# Patient Record
Sex: Female | Born: 1965 | Race: Black or African American | Hispanic: No | Marital: Married | Smoking: Former smoker
Health system: Southern US, Community
[De-identification: ages and names within clinical notes are randomized; demographics above are authoritative.]

## PROBLEM LIST (undated history)

## (undated) DIAGNOSIS — F32A Depression, unspecified: Secondary | ICD-10-CM

## (undated) DIAGNOSIS — M545 Low back pain, unspecified: Secondary | ICD-10-CM

## (undated) DIAGNOSIS — F329 Major depressive disorder, single episode, unspecified: Secondary | ICD-10-CM

## (undated) DIAGNOSIS — E119 Type 2 diabetes mellitus without complications: Secondary | ICD-10-CM

## (undated) DIAGNOSIS — R202 Paresthesia of skin: Secondary | ICD-10-CM

## (undated) DIAGNOSIS — R200 Anesthesia of skin: Secondary | ICD-10-CM

## (undated) HISTORY — DX: Paresthesia of skin: R20.2

## (undated) HISTORY — DX: Depression, unspecified: F32.A

## (undated) HISTORY — DX: Major depressive disorder, single episode, unspecified: F32.9

## (undated) HISTORY — DX: Paresthesia of skin: R20.0

## (undated) HISTORY — DX: Type 2 diabetes mellitus without complications: E11.9

## (undated) HISTORY — PX: UPPER GASTROINTESTINAL ENDOSCOPY: SHX188

## (undated) HISTORY — PX: LAPAROSCOPIC ABDOMINAL EXPLORATION: SHX6249

## (undated) HISTORY — DX: Low back pain, unspecified: M54.50

## (undated) HISTORY — DX: Low back pain: M54.5

---

## 2002-10-04 HISTORY — PX: CRYOABLATION: SHX1415

## 2003-10-05 HISTORY — PX: ABDOMINAL HYSTERECTOMY: SHX81

## 2011-11-09 ENCOUNTER — Encounter (HOSPITAL_COMMUNITY): Payer: Self-pay

## 2011-11-09 ENCOUNTER — Emergency Department (HOSPITAL_COMMUNITY)
Admission: EM | Admit: 2011-11-09 | Discharge: 2011-11-09 | Disposition: A | Discharge status: Home / Self Care | Payer: Self-pay | Attending: Emergency Medicine | Admitting: Emergency Medicine

## 2011-11-09 DIAGNOSIS — X58XXXA Exposure to other specified factors, initial encounter: Secondary | ICD-10-CM | POA: Insufficient documentation

## 2011-11-09 DIAGNOSIS — R51 Headache: Secondary | ICD-10-CM | POA: Insufficient documentation

## 2011-11-09 DIAGNOSIS — K089 Disorder of teeth and supporting structures, unspecified: Secondary | ICD-10-CM | POA: Insufficient documentation

## 2011-11-09 DIAGNOSIS — K029 Dental caries, unspecified: Secondary | ICD-10-CM | POA: Insufficient documentation

## 2011-11-09 DIAGNOSIS — S025XXA Fracture of tooth (traumatic), initial encounter for closed fracture: Secondary | ICD-10-CM | POA: Insufficient documentation

## 2011-11-09 DIAGNOSIS — K0889 Other specified disorders of teeth and supporting structures: Secondary | ICD-10-CM

## 2011-11-09 MED ORDER — OXYCODONE-ACETAMINOPHEN 5-325 MG PO TABS: 2.0000 | ORAL_TABLET | Freq: Four times a day (QID) | ORAL | Status: AC | PRN

## 2011-11-09 MED ORDER — PENICILLIN V POTASSIUM 500 MG PO TABS: 1000.0000 mg | ORAL_TABLET | Freq: Two times a day (BID) | ORAL | Status: AC

## 2011-11-09 NOTE — ED Notes (Signed)
Lt. Top tooth cracked a few days ago and pt. Is having severe pain, and swelling

## 2011-11-09 NOTE — ED Provider Notes (Signed)
History     CSN: [id number]  Arrival date & time 11/09/11  1717   First MD Initiated Contact with Patient 11/09/11 2018      Chief Complaint  Patient presents with  . Dental Pain    (Consider location/radiation/quality/duration/timing/severity/associated sxs/prior treatment) HPI This 46 year old female has a history of multiple dental extractions from decay and just moved in [place] area a couple weeks ago from [place]. She states one of her teeth with decay fractured 2 days ago and she has pain in that tooth since that time and the left molar region of tooth #14. She is no facial swelling but does have facial pain localized without radiation to the dental area she has no difficulty swallowing no stridor no drooling no neck pain or swelling no fevers no pus drainage from her mouth no chest pain or shortness of breath or other concerns. Her pain is sharp localized and severe without radiation or associated symptoms and no prior treatment. History reviewed. No pertinent past medical history.  Past Surgical History  Procedure Date  . Abdominal hysterectomy     No family history on file.  History  Substance Use Topics  . Smoking status: Former Games developer  . Smokeless tobacco: Not on file  . Alcohol Use: No    OB History    Grav Para Term Preterm Abortions TAB SAB Ect Mult Living                  Review of Systems  Constitutional: Negative for fever.       10 Systems reviewed and are negative for acute change except as noted in the HPI.  HENT: Positive for dental problem. Negative for congestion.   Eyes: Negative for discharge and redness.  Respiratory: Negative for cough and shortness of breath.   Cardiovascular: Negative for chest pain.  Gastrointestinal: Negative for vomiting and abdominal pain.  Musculoskeletal: Negative for back pain.  Skin: Negative for rash.  Neurological: Negative for syncope, numbness and headaches.  Psychiatric/Behavioral:       No behavior  change.    Allergies  Review of patient's allergies indicates no known allergies.  Home Medications   Current Outpatient Rx  Name Route Sig Dispense Refill  . NAPROXEN SODIUM 220 MG PO TABS Oral Take 220 mg by mouth 2 (two) times daily as needed. For pain    . OXYCODONE-ACETAMINOPHEN 5-325 MG PO TABS Oral Take 2 tablets by mouth every 6 (six) hours as needed for pain. 20 tablet 0  . PENICILLIN V POTASSIUM 500 MG PO TABS Oral Take 2 tablets (1,000 mg total) by mouth 2 (two) times daily. X 7 days 28 tablet 0    BP 117/75  Pulse 91  Temp(Src) 98.4 F (36.9 C) (Oral)  Resp 12  Ht 5\' 4"  (1.626 m)  Wt 160 lb (72.576 kg)  BMI 27.46 kg/m2  SpO2 97%  Physical Exam  Nursing note and vitals reviewed. Constitutional:       Awake, alert, nontoxic appearance.  HENT:  Head: Atraumatic.  Mouth/Throat: Oropharynx is clear and moist.       Multiple prior dental extractions in her teeth are nontender except for tooth #14 which is decay present with slight subluxation and fracture present at the base from decay with localized gingival tenderness without fluctuance or purulent drainage and without airway compromise  Eyes: Pupils are equal, round, and reactive to light. Right eye exhibits no discharge. Left eye exhibits no discharge.  Neck: Neck supple.  Cardiovascular: Normal  rate and regular rhythm.   No murmur heard. Pulmonary/Chest: Effort normal and breath sounds normal. No respiratory distress. She has no wheezes. She has no rales. She exhibits no tenderness.  Abdominal: Soft. There is no tenderness. There is no rebound.  Musculoskeletal: She exhibits no tenderness.       Baseline ROM, no obvious new focal weakness.  Lymphadenopathy:    She has no cervical adenopathy.  Neurological:       Mental status and motor strength appears baseline for patient and situation.  Skin: No rash noted.  Psychiatric: She has a normal mood and affect.    ED Course  Procedures (including critical care  time)  Labs Reviewed - No data to display No results found.   1. Pain, dental   2. Tooth fracture   3. Dental cavity       MDM          Hurman Horn, MD 11/10/11 2120

## 2011-11-09 NOTE — ED Notes (Signed)
PT given ice pack 

## 2011-11-09 NOTE — ED Notes (Signed)
Pt ambualted with a steady gait; VSS; A&Ox3; no signs of distress; pt has no questions at this time; respirations even and unlabored; skin warm and dry.

## 2012-06-26 ENCOUNTER — Ambulatory Visit (INDEPENDENT_AMBULATORY_CARE_PROVIDER_SITE_OTHER): Payer: BC Managed Care – PPO | Admitting: Internal Medicine

## 2012-06-26 VITALS — BP 114/62 | HR 95 | Temp 98.3°F | Resp 16 | Ht 64.0 in | Wt 160.0 lb

## 2012-06-26 DIAGNOSIS — F5105 Insomnia due to other mental disorder: Secondary | ICD-10-CM

## 2012-06-26 DIAGNOSIS — F411 Generalized anxiety disorder: Secondary | ICD-10-CM

## 2012-06-26 DIAGNOSIS — F419 Anxiety disorder, unspecified: Secondary | ICD-10-CM

## 2012-06-26 DIAGNOSIS — F329 Major depressive disorder, single episode, unspecified: Secondary | ICD-10-CM

## 2012-06-26 DIAGNOSIS — F341 Dysthymic disorder: Secondary | ICD-10-CM

## 2012-06-26 DIAGNOSIS — F430 Acute stress reaction: Secondary | ICD-10-CM

## 2012-06-26 DIAGNOSIS — G4700 Insomnia, unspecified: Secondary | ICD-10-CM

## 2012-06-26 MED ORDER — CITALOPRAM HYDROBROMIDE 20 MG PO TABS: 20.0000 mg | ORAL_TABLET | Freq: Every day | ORAL | Status: DC

## 2012-06-26 MED ORDER — CLONAZEPAM 0.5 MG PO TABS: 0.5000 mg | ORAL_TABLET | Freq: Three times a day (TID) | ORAL | Status: DC | PRN

## 2012-06-26 NOTE — Progress Notes (Signed)
  Subjective:    Patient ID: Kelly Simon, female    DOB: [birth year], 46 y.o.   MRN: [id number]  HPI Referred to [place] by psychologist Tom Hedding.  89yo AA F pt's mom has a reoccurance of ovarian ca (first dx 35 years ago) and she is experiencing stress related to balancing caring for her mom in [place] and working in [place].  Her dad is the main caregiver to her mother, but he has reached a point where he is needing more help.  The grief she experiences has been preventing her from sleeping and making her less interested in participating in her normal activities.  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  She complains of feeling depressed.  She denies feelings of anxiety, but does complain of feeling overwhelmed.  She has one month FMLA from Dr Ellery Plunk before returning to work. Has sxt anhedonism,pervasive anxiety, withdrawal from activities,insomnia,Decreased productivity, decreased attention, crying spells, reduced motivation and very low energy. There is no past history of depression or anxiety or need for treatment.   PMHX:  DMII - controlled with diet and exercise (recent weight loss in past 2 years)160lbs  Meds: Not on any medications currently.  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 clonazePAM (KLONOPIN) 0.5 MG tablet    Sig: Take 1 tablet (0.5 mg total) by mouth 3 (three) times daily as needed for anxiety.    Dispense:  90 tablet    Refill:  0  .  citalopram (CELEXA) 20 MG tablet    Sig: Take 1 tablet (20 mg total) by mouth daily.    Dispense:  30 tablet    Refill:  3   Continue counseling with Dr. Ellery Plunk Celexa for depressive symptoms Counseled pt. regarding options to help her address her mothers health care needs  F/U 2 weeks RTC as needed

## 2012-06-28 NOTE — Progress Notes (Signed)
appt made with Dr. Merla Riches for 07/12/12

## 2012-07-09 ENCOUNTER — Encounter: Payer: Self-pay | Admitting: Internal Medicine

## 2012-07-12 ENCOUNTER — Encounter: Payer: Self-pay | Admitting: Internal Medicine

## 2012-07-12 ENCOUNTER — Ambulatory Visit (INDEPENDENT_AMBULATORY_CARE_PROVIDER_SITE_OTHER): Payer: BC Managed Care – PPO | Admitting: Internal Medicine

## 2012-07-12 VITALS — BP 116/82 | HR 76 | Temp 98.8°F | Resp 16 | Ht 63.0 in | Wt 156.0 lb

## 2012-07-12 DIAGNOSIS — Z23 Encounter for immunization: Secondary | ICD-10-CM

## 2012-07-12 DIAGNOSIS — F418 Other specified anxiety disorders: Secondary | ICD-10-CM | POA: Insufficient documentation

## 2012-07-12 DIAGNOSIS — R002 Palpitations: Secondary | ICD-10-CM

## 2012-07-12 DIAGNOSIS — IMO0001 Reserved for inherently not codable concepts without codable children: Secondary | ICD-10-CM

## 2012-07-12 DIAGNOSIS — E119 Type 2 diabetes mellitus without complications: Secondary | ICD-10-CM

## 2012-07-12 DIAGNOSIS — F341 Dysthymic disorder: Secondary | ICD-10-CM

## 2012-07-12 LAB — COMPREHENSIVE METABOLIC PANEL
Albumin: 4.3 g/dL (ref 3.5–5.2)
Alkaline Phosphatase: 89 U/L (ref 39–117)
BUN: 8 mg/dL (ref 6–23)
Glucose, Bld: 334 mg/dL — ABNORMAL HIGH (ref 70–99)
Potassium: 4.3 mEq/L (ref 3.5–5.3)

## 2012-07-12 LAB — CBC WITH DIFFERENTIAL/PLATELET
Basophils Relative: 1 % (ref 0–1)
Eosinophils Absolute: 0.1 10*3/uL (ref 0.0–0.7)
HCT: 44.7 % (ref 36.0–46.0)
Hemoglobin: 15.1 g/dL — ABNORMAL HIGH (ref 12.0–15.0)
MCH: 28.2 pg (ref 26.0–34.0)
MCHC: 33.8 g/dL (ref 30.0–36.0)
MCV: 83.4 fL (ref 78.0–100.0)
Monocytes Absolute: 0.4 10*3/uL (ref 0.1–1.0)
Monocytes Relative: 6 % (ref 3–12)

## 2012-07-12 LAB — LIPID PANEL
Cholesterol: 177 mg/dL (ref 0–200)
Triglycerides: 155 mg/dL — ABNORMAL HIGH (ref <150)
VLDL: 31 mg/dL (ref 0–40)

## 2012-07-12 MED ORDER — CLONAZEPAM 0.5 MG PO TABS: 0.5000 mg | ORAL_TABLET | Freq: Three times a day (TID) | ORAL | Status: DC | PRN

## 2012-07-12 MED ORDER — METFORMIN HCL 1000 MG PO TABS: 1000.0000 mg | ORAL_TABLET | Freq: Two times a day (BID) | ORAL | Status: DC

## 2012-07-12 NOTE — Progress Notes (Signed)
  Subjective:    Patient ID: Kelly Simon, female    DOB: Feb 08, 1966, 46 y.o.   MRN: [id number]  [facility] had some response to medication and continued therapy although her dose of Klonopin makes her sleepy so he she is reduced at a half. No side effects with Celexa. She still has occas insomnia. Her mother has improved slightly with chemotherapy now that she is over the side effects.Wakes and doesn't want to get out of bed afraid of what will happen next.  Also has noticed a few pounds of weight loss but denies polyuria or polydipsia. Diabetes was diagnosed at age 89 she was started immediately on Lantus with a daytime sliding scale of NovoLog. She discontinued this in 2011 when she was stable and after having lost from 280 to 156 pounds by virtue of a good diet/Exercise plan. She is Not followed for any care As she has recently moved here. She has noticed some palpitations with her recent weight loss.   Review of Systems Mild fatigue/no chills or night sweats Denies vision changes/no recent ophthalmology exam No shortness of breath or dyspnea on exertion No chest pain/no edema No genitourinary or gastrointestinal complaints No paresthesias or gait problems/no history of neuropathy    Objective:   Physical Exam Weight 156 Blood pressure 116/82  Skin clear HEENT clear No thyromegaly or lymphadenopathy/peers a questionable nodule on the right side and this will be followed up at her next exam Chest clear to auscultation Heart regular without murmur Extremities with no sensory or vascular loss and no edema       Results for orders placed in visit on 07/12/12  POCT GLYCOSYLATED HEMOGLOBIN (HGB A1C)      Component Value Range   Hemoglobin A1C =>14.0      Assessment & Plan:  Problem #1 diabetes mellitus with loss of control She will start on metformin 1 g twice a day Other screening labs will be performed  Problem #2 depression with anxiety-Only mild improvement Continue same  plan/therapy  Problem #3 palpitations Include TSH Recheck thyroid exam at followup  Meds ordered this encounter  Medications  . metFORMIN (GLUCOPHAGE) 1000 MG tablet    Sig: Take 1 tablet (1,000 mg total) by mouth 2 (two) times daily with a meal.    Dispense:  180 tablet    Refill:  3  . clonazePAM (KLONOPIN) 0.5 MG tablet    Sig: Take 1 tablet (0.5 mg total) by mouth 3 (three) times daily as needed for anxiety.    Dispense:  90 tablet    Refill:  2  No home monitoring for now/ recheck 2 weeks She continues out of work on [employer] from her psychologist and I concur with this decision

## 2012-07-17 ENCOUNTER — Encounter: Payer: Self-pay | Admitting: Internal Medicine

## 2012-07-26 ENCOUNTER — Encounter: Payer: Self-pay | Admitting: Internal Medicine

## 2012-07-26 ENCOUNTER — Ambulatory Visit (INDEPENDENT_AMBULATORY_CARE_PROVIDER_SITE_OTHER): Payer: BC Managed Care – PPO | Admitting: Internal Medicine

## 2012-07-26 VITALS — BP 110/72 | HR 96 | Temp 98.3°F | Resp 16 | Ht 63.0 in | Wt 150.8 lb

## 2012-07-26 DIAGNOSIS — IMO0001 Reserved for inherently not codable concepts without codable children: Secondary | ICD-10-CM

## 2012-07-26 MED ORDER — GLIPIZIDE 10 MG PO TABS: 10.0000 mg | ORAL_TABLET | Freq: Two times a day (BID) | ORAL | Status: DC

## 2012-07-26 NOTE — Progress Notes (Signed)
  Subjective:    Patient ID: Kelly Simon, female    DOB: Jun 02, 1966, 46 y.o.   MRN: [id number]  46 yo F presents for routine health care especially f/u on DM.  HPI At her last visit we were surprised her DM was more out of control than expected.  THe pt c/o stomach problems 1-2 hrs after taking the metformin. She discontinued the medication She would like to avoid resuming insulin which was started at the original diagnosis without trying oral agents The pt c/o ingrown toenails.  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  At this time she is pain free.  However the big toe on her R foot sometimes pinches her, when this happens next we will refer her to local podiatrists.    She is now taking  Only 1/2 of her clonezepam pill tid, she is less drowsy and this seems to be working for her.    Review of Systems No weight loss No fevers or night sweats No chest pain or palpitations No edema GI/GU negative    Objective:   Physical Exam Filed Vitals:   07/26/12 1124  BP: 110/72  Pulse: 96  Temp: 98.3 F (36.8 C)  Resp: 16   Heart regular Lungs clear       Assessment & Plan:  Problem #1 diabetes mellitus uncontrolled Meds ordered this encounter  Medications  . glipiZIDE (GLUCOTROL) 10 MG tablet    Sig: Take 1 tablet (10 mg total) by mouth 2 (two) times daily before a meal.    Dispense:  60 tablet    Refill:  3  Recheck in 3 weeks

## 2012-08-12 ENCOUNTER — Other Ambulatory Visit: Payer: Self-pay | Admitting: Internal Medicine

## 2012-08-16 ENCOUNTER — Ambulatory Visit (INDEPENDENT_AMBULATORY_CARE_PROVIDER_SITE_OTHER): Payer: BC Managed Care – PPO | Admitting: Internal Medicine

## 2012-08-16 ENCOUNTER — Encounter: Payer: Self-pay | Admitting: Internal Medicine

## 2012-08-16 VITALS — BP 120/88 | HR 81 | Temp 98.8°F | Resp 16 | Ht 63.0 in | Wt 167.4 lb

## 2012-08-16 DIAGNOSIS — E119 Type 2 diabetes mellitus without complications: Secondary | ICD-10-CM

## 2012-08-16 DIAGNOSIS — F418 Other specified anxiety disorders: Secondary | ICD-10-CM

## 2012-08-16 DIAGNOSIS — Z23 Encounter for immunization: Secondary | ICD-10-CM

## 2012-08-16 DIAGNOSIS — E669 Obesity, unspecified: Secondary | ICD-10-CM | POA: Insufficient documentation

## 2012-08-16 DIAGNOSIS — Z6829 Body mass index (BMI) 29.0-29.9, adult: Secondary | ICD-10-CM

## 2012-08-16 MED ORDER — SITAGLIPTIN PHOSPHATE 100 MG PO TABS: 100.0000 mg | ORAL_TABLET | Freq: Every day | ORAL | Status: DC

## 2012-08-16 NOTE — Progress Notes (Signed)
  Subjective:    Patient ID: Kelly Simon, female    DOB: [birth year], 46 y.o.   MRN: [id number]  HPIf/u DM-uncontrolled Hemoglobin A1c over 14 one month ago Started metformin and developed GI intolerance rapidly so discontinued Started Glucotrol 10 mg XL 2 weeks ag0-actually 3 weeks ago and blood sugars have dropped from 300-180 early morning fasting No side effects   In the interim has been started on Abilify by new psychiatrist Headen-Continues therapy with psychologyHEDDING///No change in Celexa or Klonopin started by [place]   Review of Systems     Objective:   Physical Exam No changes from last visit       Assessment & Plan:  Diabetes uncontrolled BMI 29  Continued Glucotrol 10 XL Head Januvia 100 daily Plan on repeat hemoglobin A1c in 2 months Continue daily blood sugars and followup sooner if loses control Remember she is trying to avoid injection therapy Refill medicines when necessary in the meantime Discussed the role of whole fresh foods with calorie control and weight loss approaching ideal body weight of less than 130 pounds

## 2012-10-12 ENCOUNTER — Ambulatory Visit (INDEPENDENT_AMBULATORY_CARE_PROVIDER_SITE_OTHER): Payer: BC Managed Care – PPO | Admitting: Physician Assistant

## 2012-10-12 ENCOUNTER — Telehealth: Payer: Self-pay

## 2012-10-12 VITALS — BP 117/73 | HR 95 | Temp 98.0°F | Resp 16 | Ht 64.0 in | Wt 160.0 lb

## 2012-10-12 DIAGNOSIS — E119 Type 2 diabetes mellitus without complications: Secondary | ICD-10-CM

## 2012-10-12 DIAGNOSIS — E162 Hypoglycemia, unspecified: Secondary | ICD-10-CM

## 2012-10-12 DIAGNOSIS — R8271 Bacteriuria: Secondary | ICD-10-CM

## 2012-10-12 DIAGNOSIS — R82998 Other abnormal findings in urine: Secondary | ICD-10-CM

## 2012-10-12 LAB — POCT URINALYSIS DIPSTICK
Protein, UA: NEGATIVE
Urobilinogen, UA: 0.2
pH, UA: 5.5

## 2012-10-12 LAB — POCT CBC
Granulocyte percent: 42.2 %G (ref 37–80)
Lymph, poc: 3.7 — AB (ref 0.6–3.4)
MCH, POC: 27 pg (ref 27–31.2)
MCHC: 31 g/dL — AB (ref 31.8–35.4)
MCV: 87.3 fL (ref 80–97)
MID (cbc): 0.5 (ref 0–0.9)
POC LYMPH PERCENT: 51.4 %L — AB (ref 10–50)
Platelet Count, POC: 310 10*3/uL (ref 142–424)
RDW, POC: 14.9 %
WBC: 7.2 10*3/uL (ref 4.6–10.2)

## 2012-10-12 LAB — POCT GLYCOSYLATED HEMOGLOBIN (HGB A1C): Hemoglobin A1C: 7.6

## 2012-10-12 LAB — POCT UA - MICROSCOPIC ONLY
Casts, Ur, LPF, POC: NEGATIVE
Crystals, Ur, HPF, POC: NEGATIVE
Yeast, UA: NEGATIVE

## 2012-10-12 NOTE — Patient Instructions (Signed)
STOP the glipizide. Eat small frequent meals and check your blood sugar at least twice daily: once when you are fasting and once 2-3 hours after the largest meal of the day. Please check at other times during the day if you feel like it might be low.

## 2012-10-12 NOTE — Progress Notes (Signed)
Subjective:    Patient ID: Kelly Simon, female    DOB: Oct 24, 1965, 47 y.o.   MRN: [id number]  HPI This 47 y.o. female presents for evaluation of hypoglycemia.  She called earlier today asking what she should do with a blood sugar of 40.  She was advised to come in to be seen.  She arrived and was triaged and seen my me urgently.  She was given apple juice and peanut butter crackers while I took the history. She feels as though she may pass out, and feels dizzy.  No HA, SOB, CP, vision changes.  No nausea, vomiting, stool changes.  Some muscle cramping, which she recalls having had before with low blood sugar.  Glucose typically 80's-120's.  Today her highest reading was 11:50 am, 69.  Progressively lower during the day, despite normal eating.  She notes that her DM was long controlled without medications after she lost 139 pounds.  Then she developed depression and started treatment, which then resulted in loss of glucose control.  The last medication change was 09/19/2012 when the Abilify was increased from 2 mg to 5 mg.   Past Medical History  Diagnosis Date  . Diabetes mellitus without complication   . Depression     Past Surgical History  Procedure Date  . Abdominal hysterectomy     Prior to Admission medications   Medication Sig Start Date End Date Taking? Authorizing Provider  ARIPiprazole (ABILIFY) 5 MG tablet Take 5 mg by mouth daily.   Yes Historical Provider, MD  citalopram (CELEXA) 20 MG tablet Take 1 tablet (20 mg total) by mouth daily. 06/26/12  Yes Tonye Pearson, MD  clonazePAM (KLONOPIN) 0.5 MG tablet Take 1 tablet (0.5 mg total) by mouth 3 (three) times daily as needed for anxiety. 07/12/12  Yes Tonye Pearson, MD  glipiZIDE (GLUCOTROL) 10 MG tablet Take 1 tablet (10 mg total) by mouth 2 (two) times daily before a meal. 07/26/12  Yes Tonye Pearson, MD  sitaGLIPtin (JANUVIA) 100 MG tablet Take 1 tablet (100 mg total) by mouth daily. 08/16/12  Yes Tonye Pearson, MD    Allergies  Allergen Reactions  . Adhesive (Tape) Rash    History   Social History  . Marital Status: Single    Spouse Name: [name]    Number of Children: 3  . Years of Education: 12+   Occupational History  . CUSTOMER SERVICE    Social History Main Topics  . Smoking status: Former Smoker -- 26 years    Types: Cigarettes    Quit date: 04/03/2008  . Smokeless tobacco: Never Used  . Alcohol Use: No  . Drug Use: No  . Sexually Active: Yes -- Female partner(s)    Birth Control/ Protection: Surgical   Other Topics Concern  . Not on file   Social History Narrative   Engaged to be married.  Lives with him currently.  Two sons are grown and live independently.  The youngest is 28 and lives with her.    Family History  Problem Relation Age of Onset  . Cancer Mother     Cervical s/p hysterectomy; mets to the abdomen  . Hypertension Mother     Review of Systems As above.    Objective:   Physical Exam Blood pressure 117/73, pulse 95, temperature 98 F (36.7 C), temperature source Oral, resp. rate 16, height 5\' 4"  (1.626 m), weight 160 lb (72.576 kg), SpO2 97.00%. Body mass index is 27.46 kg/(m^2). Well-developed,  well nourished BF who is awake, alert and oriented, in NAD. HEENT: [place]/AT, PERRL, EOMI.  Sclera and conjunctiva are clear.   Neck: supple, non-tender, no lymphadenopathy, thyromegaly. Heart: RRR, no murmur Lungs: normal effort, CTA Extremities: no cyanosis, clubbing or edema. Skin: warm and dry without rash. Psychologic: good mood and appropriate affect, normal speech and behavior.  Glucose rose to 68 (on her meter) after a juice box and packet of crackers. She reports feeling better.  Results for orders placed in visit on 10/12/12  GLUCOSE, POCT (MANUAL RESULT ENTRY)      Component Value Range   POC Glucose 65 (*) 70 - 99 mg/dl  POCT GLYCOSYLATED HEMOGLOBIN (HGB A1C)      Component Value Range   Hemoglobin A1C 7.6    POCT CBC       Component Value Range   WBC 7.2  4.6 - 10.2 K/uL   Lymph, poc 3.7 (*) 0.6 - 3.4   POC LYMPH PERCENT 51.4 (*) 10 - 50 %L   MID (cbc) 0.5  0 - 0.9   POC MID % 6.4  0 - 12 %M   POC Granulocyte 3.0  2 - 6.9   Granulocyte percent 42.2  37 - 80 %G   RBC 4.33  4.04 - 5.48 M/uL   Hemoglobin 11.7 (*) 12.2 - 16.2 g/dL   HCT, POC 78.2  95.6 - 47.9 %   MCV 87.3  80 - 97 fL   MCH, POC 27.0  27 - 31.2 pg   MCHC 31.0 (*) 31.8 - 35.4 g/dL   RDW, POC 21.3     Platelet Count, POC 310  142 - 424 K/uL   MPV 8.2  0 - 99.8 fL  POCT UA - MICROSCOPIC ONLY      Component Value Range   WBC, Ur, HPF, POC 2-4     RBC, urine, microscopic 0-2     Bacteria, U Microscopic 3+     Mucus, UA neg     Epithelial cells, urine per micros 2-3     Crystals, Ur, HPF, POC neg     Casts, Ur, LPF, POC neg     Yeast, UA neg    POCT URINALYSIS DIPSTICK      Component Value Range   Color, UA yellow     Clarity, UA cloudy     Glucose, UA neg     Bilirubin, UA neg     Ketones, UA neg     Spec Grav, UA >=1.030     Blood, UA neg     pH, UA 5.5     Protein, UA neg     Urobilinogen, UA 0.2     Nitrite, UA neg     Leukocytes, UA Trace     Glucose 68 before she left (on her meter)    Assessment & Plan:   1. Hypoglycemia  POCT glucose (manual entry), POCT CBC, POCT UA - Microscopic Only, POCT urinalysis dipstick  2. DM (diabetes mellitus)  POCT glycosylated hemoglobin (Hb A1C)  3. Bacteria in urine  Urine culture   HOLD GLIPIZIDE.  She is feeling better.  She is instructed to eat a full meal, with more carbs than usual, upon going home.  She needs to carefully monitor her glucose this evening and over night.  If it drops below 40, she will call 911.  She has an OV with Dr. Merla Riches 10/18/2012, sooner if needed.

## 2012-10-12 NOTE — Telephone Encounter (Signed)
Yes patient needs to be seen today.  We are open until 8:30pm.  We will likely need to adjust medications.

## 2012-10-12 NOTE — Telephone Encounter (Signed)
Should I tell pt to RTC for evaluation and possible change of her meds?

## 2012-10-12 NOTE — Telephone Encounter (Signed)
Called pt, LMOM to RTC and call back.

## 2012-10-12 NOTE — Telephone Encounter (Signed)
PT STATES SHE IS DIABETIC AND IS ON MEDICINE BUT HER SUGAR STILL HAVE BEEN IN THE 40'S OR JUST A LITTLE HIGHER DIDN'T KNOW IF HER MEDS NEED CHANGING PLEASE CALL [phone]

## 2012-10-13 NOTE — Telephone Encounter (Signed)
Pt came in to be seen on 10/12/12 after getting our message.

## 2012-10-17 ENCOUNTER — Other Ambulatory Visit: Payer: Self-pay | Admitting: *Deleted

## 2012-10-17 LAB — URINE CULTURE

## 2012-10-17 MED ORDER — CEPHALEXIN 500 MG PO CAPS: 500.0000 mg | ORAL_CAPSULE | Freq: Four times a day (QID) | ORAL | Status: DC

## 2012-10-18 ENCOUNTER — Ambulatory Visit (INDEPENDENT_AMBULATORY_CARE_PROVIDER_SITE_OTHER): Payer: BC Managed Care – PPO | Admitting: Internal Medicine

## 2012-10-18 VITALS — BP 103/65 | HR 84 | Temp 98.1°F | Resp 16 | Ht 63.0 in | Wt 155.0 lb

## 2012-10-18 DIAGNOSIS — Z1231 Encounter for screening mammogram for malignant neoplasm of breast: Secondary | ICD-10-CM

## 2012-10-18 DIAGNOSIS — E119 Type 2 diabetes mellitus without complications: Secondary | ICD-10-CM

## 2012-10-18 NOTE — Addendum Note (Signed)
Addended by: Donna Christen L on: 10/18/2012 11:38 AM   Modules accepted: Orders

## 2012-10-18 NOTE — Progress Notes (Signed)
  Subjective:    Patient ID: Kelly Simon, female    DOB: 12-15-1965, 47 y.o.   MRN: [id number]  HPIsee 1/10 hypogly Off glip No hypo since Am sugars 70-90 on Januvia 100 Weight down 12lbs since 11/13 OV via increased exercise Mental status more stable    Review of Systems     Objective:   Physical Exam BP 103/65  Pulse 84  Temp 98.1 F (36.7 C)  Resp 16  Ht 5\' 3"  (1.6 m)  Wt 155 lb (70.308 kg)  BMI 27.46 kg/m2 orientedTTP Perrla CN2-12 intact Mood good Judgement sound       Assessment & Plan:  AODM-much better control  D/c Januvia and follow morning BS F/u 4/14 Call sooner if pattern>120in am

## 2013-01-17 ENCOUNTER — Ambulatory Visit (INDEPENDENT_AMBULATORY_CARE_PROVIDER_SITE_OTHER): Payer: BC Managed Care – PPO | Admitting: Internal Medicine

## 2013-01-17 VITALS — BP 116/80 | HR 70 | Temp 98.2°F | Resp 16 | Ht 63.5 in | Wt 170.0 lb

## 2013-01-17 DIAGNOSIS — E119 Type 2 diabetes mellitus without complications: Secondary | ICD-10-CM

## 2013-01-17 DIAGNOSIS — F341 Dysthymic disorder: Secondary | ICD-10-CM

## 2013-01-17 DIAGNOSIS — Z6829 Body mass index (BMI) 29.0-29.9, adult: Secondary | ICD-10-CM

## 2013-01-17 DIAGNOSIS — F418 Other specified anxiety disorders: Secondary | ICD-10-CM

## 2013-01-17 NOTE — Progress Notes (Signed)
  Subjective:    Patient ID: Kelly Simon, female    DOB: [birth year], 47 y.o.   MRN: [id number]  HPIsee 1/14-stopped Januvia Off glip due to hypogly 1/14 Home BS wnl!!!despite wt gain  Back to work [place]/commutes daily/no time for exercise or cooking own food 155 to 170 Review of Systems     Objective:   Physical Exam BP 116/80  Pulse 70  Temp(Src) 98.2 F (36.8 C)  Resp 16  Ht 5' 3.5" (1.613 m)  Wt 170 lb (77.111 kg)  BMI 29.64 kg/m2 Pupils equal round reactive to light and accommodation Heart regular No edema No sensory loss in feet   Results for orders placed in visit on 01/17/13  POCT GLYCOSYLATED HEMOGLOBIN (HGB A1C)      Result Value Range   Hemoglobin A1C 5.4         Assessment & Plan:  DM (diabetes mellitus) -this hemoglobin A1c level is without medication for 3 months and should indicate that she is no longer glucose intolerant/she may followup in 1 year Continue to check home blood sugars once a week  Depression with anxiety--continue with psychiatry  BMI 29.0-29.9,adult

## 2013-05-02 ENCOUNTER — Ambulatory Visit (INDEPENDENT_AMBULATORY_CARE_PROVIDER_SITE_OTHER): Payer: BC Managed Care – PPO | Admitting: Internal Medicine

## 2013-05-02 VITALS — BP 97/61 | HR 72 | Temp 98.2°F | Resp 16 | Ht 64.0 in | Wt 186.0 lb

## 2013-05-02 DIAGNOSIS — Z6829 Body mass index (BMI) 29.0-29.9, adult: Secondary | ICD-10-CM

## 2013-05-02 DIAGNOSIS — R635 Abnormal weight gain: Secondary | ICD-10-CM

## 2013-05-02 DIAGNOSIS — D649 Anemia, unspecified: Secondary | ICD-10-CM

## 2013-05-02 DIAGNOSIS — E119 Type 2 diabetes mellitus without complications: Secondary | ICD-10-CM

## 2013-05-02 NOTE — Progress Notes (Signed)
  Subjective:    Patient ID: Kelly Simon, female    DOB: June 27, 1966, 48 y.o.   MRN: [id number]  HPIgained 170-186 3 months Feels lightheaded, sl blurr vision(eye exam was OK but correction needed has increased-no DM chges) Tingling in fingers and toes off and on when working Lots of stress-Mom died after long battle with cerv Ca in May Eating more Walking less(was at 2 hr/day)  No new psych meds or dose chges   Review of Systems  Constitutional: Positive for activity change, appetite change and unexpected weight change. Negative for fatigue.  HENT: Negative for neck pain.   Eyes: Positive for visual disturbance. Negative for photophobia, pain and redness.  Respiratory: Negative for chest tightness, shortness of breath and wheezing.   Cardiovascular: Negative for chest pain, palpitations and leg swelling.  Endocrine: Negative for cold intolerance, heat intolerance and polyuria.  Neurological: Negative for headaches.       Objective:   Physical Exam  Constitutional: She is oriented to person, place, and time. She appears well-developed and well-nourished.  HENT:  Head: Normocephalic.  Right Ear: External ear normal.  Left Ear: External ear normal.  Nose: Nose normal.  Mouth/Throat: Oropharynx is clear and moist.  Eyes: Conjunctivae and EOM are normal. Pupils are equal, round, and reactive to light.  Neck: Neck supple. No thyromegaly present.  Cardiovascular: Normal rate, regular rhythm, normal heart sounds and intact distal pulses.   No murmur heard. Pulmonary/Chest: Breath sounds normal. She has no wheezes.  Musculoskeletal: She exhibits no edema.  Neurological: She is alert and oriented to person, place, and time. She has normal reflexes. She displays normal reflexes. No cranial nerve deficit.  Psychiatric: She has a normal mood and affect. Her behavior is normal. Thought content normal.  BP 97/61  Pulse 72  Temp(Src) 98.2 F (36.8 C)  Resp 16  Ht 5\' 4"  (1.626 m)  Wt  186 lb (84.369 kg)  BMI 31.91 kg/m2   Results for orders placed in visit on 05/02/13  POCT GLYCOSYLATED HEMOGLOBIN (HGB A1C)      Result Value Range   Hemoglobin A1C 6.0            Assessment & Plan:  Diabetes -in remission///resume diet  Anemia-reck  BMI 29.0-29.9,adult incr to 31  Weight gain - Plan: TSH/resume exercise  Mood dis--no chg/psychiatry f/u  Paresthesias and lightheadedness--follow  F/u 6mos

## 2013-05-03 ENCOUNTER — Encounter: Payer: Self-pay | Admitting: Internal Medicine

## 2013-05-03 LAB — CBC
HCT: 38.7 % (ref 36.0–46.0)
Hemoglobin: 12.8 g/dL (ref 12.0–15.0)
MCHC: 33.1 g/dL (ref 30.0–36.0)
MCV: 82.3 fL (ref 78.0–100.0)
RDW: 15.5 % (ref 11.5–15.5)

## 2013-05-03 LAB — COMPREHENSIVE METABOLIC PANEL
AST: 14 U/L (ref 0–37)
Albumin: 4.3 g/dL (ref 3.5–5.2)
Alkaline Phosphatase: 85 U/L (ref 39–117)
BUN: 19 mg/dL (ref 6–23)
Calcium: 9.3 mg/dL (ref 8.4–10.5)
Chloride: 107 mEq/L (ref 96–112)
Creat: 0.82 mg/dL (ref 0.50–1.10)
Glucose, Bld: 76 mg/dL (ref 70–99)

## 2013-05-03 LAB — TSH: TSH: 1.174 u[IU]/mL (ref 0.350–4.500)

## 2013-11-15 ENCOUNTER — Ambulatory Visit (INDEPENDENT_AMBULATORY_CARE_PROVIDER_SITE_OTHER): Payer: BC Managed Care – PPO | Admitting: Family Medicine

## 2013-11-15 ENCOUNTER — Ambulatory Visit: Payer: BC Managed Care – PPO

## 2013-11-15 VITALS — BP 112/82 | HR 80 | Temp 98.0°F | Resp 16 | Ht 62.5 in | Wt 182.8 lb

## 2013-11-15 DIAGNOSIS — R05 Cough: Secondary | ICD-10-CM

## 2013-11-15 DIAGNOSIS — R059 Cough, unspecified: Secondary | ICD-10-CM

## 2013-11-15 DIAGNOSIS — E1165 Type 2 diabetes mellitus with hyperglycemia: Secondary | ICD-10-CM

## 2013-11-15 DIAGNOSIS — J9801 Acute bronchospasm: Secondary | ICD-10-CM

## 2013-11-15 DIAGNOSIS — IMO0001 Reserved for inherently not codable concepts without codable children: Secondary | ICD-10-CM

## 2013-11-15 DIAGNOSIS — N390 Urinary tract infection, site not specified: Secondary | ICD-10-CM

## 2013-11-15 DIAGNOSIS — B370 Candidal stomatitis: Secondary | ICD-10-CM

## 2013-11-15 DIAGNOSIS — J209 Acute bronchitis, unspecified: Secondary | ICD-10-CM

## 2013-11-15 LAB — POCT CBC
GRANULOCYTE PERCENT: 61.4 % (ref 37–80)
HCT, POC: 46.7 % (ref 37.7–47.9)
Hemoglobin: 14.6 g/dL (ref 12.2–16.2)
Lymph, poc: 2.8 (ref 0.6–3.4)
MCH, POC: 27.8 pg (ref 27–31.2)
MCHC: 31.3 g/dL — AB (ref 31.8–35.4)
MCV: 88.8 fL (ref 80–97)
MID (CBC): 0.5 (ref 0–0.9)
MPV: 9.3 fL (ref 0–99.8)
PLATELET COUNT, POC: 303 10*3/uL (ref 142–424)
POC GRANULOCYTE: 5.3 (ref 2–6.9)
POC LYMPH %: 32.6 % (ref 10–50)
POC MID %: 6 %M (ref 0–12)
RBC: 5.26 M/uL (ref 4.04–5.48)
RDW, POC: 14.1 %
WBC: 8.7 10*3/uL (ref 4.6–10.2)

## 2013-11-15 LAB — POCT INFLUENZA A/B
INFLUENZA A, POC: NEGATIVE
INFLUENZA B, POC: NEGATIVE

## 2013-11-15 LAB — COMPREHENSIVE METABOLIC PANEL
ALK PHOS: 125 U/L — AB (ref 39–117)
ALT: 11 U/L (ref 0–35)
AST: 11 U/L (ref 0–37)
Albumin: 4 g/dL (ref 3.5–5.2)
BILIRUBIN TOTAL: 0.5 mg/dL (ref 0.2–1.2)
BUN: 7 mg/dL (ref 6–23)
CO2: 24 mEq/L (ref 19–32)
Calcium: 9.5 mg/dL (ref 8.4–10.5)
Chloride: 103 mEq/L (ref 96–112)
Creat: 0.74 mg/dL (ref 0.50–1.10)
GLUCOSE: 361 mg/dL — AB (ref 70–99)
Potassium: 4.4 mEq/L (ref 3.5–5.3)
SODIUM: 141 meq/L (ref 135–145)
TOTAL PROTEIN: 6.8 g/dL (ref 6.0–8.3)

## 2013-11-15 LAB — POCT URINALYSIS DIPSTICK
Bilirubin, UA: NEGATIVE
Glucose, UA: 500
Ketones, UA: 8
LEUKOCYTES UA: NEGATIVE
Nitrite, UA: POSITIVE
PROTEIN UA: NEGATIVE
Spec Grav, UA: 1.015
UROBILINOGEN UA: 0.2
pH, UA: 5.5

## 2013-11-15 LAB — POCT UA - MICROSCOPIC ONLY
CASTS, UR, LPF, POC: NEGATIVE
CRYSTALS, UR, HPF, POC: NEGATIVE
Mucus, UA: NEGATIVE
YEAST UA: POSITIVE

## 2013-11-15 LAB — GLUCOSE, POCT (MANUAL RESULT ENTRY): POC Glucose: 414 mg/dl — AB (ref 70–99)

## 2013-11-15 LAB — POCT GLYCOSYLATED HEMOGLOBIN (HGB A1C)

## 2013-11-15 LAB — MICROALBUMIN, URINE: Microalb, Ur: 0.62 mg/dL (ref 0.00–1.89)

## 2013-11-15 MED ORDER — AZITHROMYCIN 250 MG PO TABS: ORAL_TABLET | ORAL | Status: DC

## 2013-11-15 MED ORDER — NYSTATIN 100000 UNIT/ML MT SUSP: 5.0000 mL | Freq: Four times a day (QID) | OROMUCOSAL | Status: DC

## 2013-11-15 MED ORDER — SITAGLIPTIN PHOSPHATE 50 MG PO TABS: 50.0000 mg | ORAL_TABLET | Freq: Every day | ORAL | Status: DC

## 2013-11-15 MED ORDER — FLUCONAZOLE 150 MG PO TABS: 150.0000 mg | ORAL_TABLET | Freq: Once | ORAL | Status: DC

## 2013-11-15 MED ORDER — ALBUTEROL SULFATE (2.5 MG/3ML) 0.083% IN NEBU
2.5000 mg | INHALATION_SOLUTION | Freq: Once | RESPIRATORY_TRACT | Status: AC
Start: 2013-11-15 — End: 2013-11-15
  Administered 2013-11-15: 2.5 mg via RESPIRATORY_TRACT

## 2013-11-15 MED ORDER — GLIPIZIDE 5 MG PO TABS: 5.0000 mg | ORAL_TABLET | Freq: Two times a day (BID) | ORAL | Status: DC

## 2013-11-15 NOTE — Progress Notes (Addendum)
Subjective:    Patient ID: Kelly Simon, female    DOB: May 06, 1966, 48 y.o.   MRN: [id number]  HPI This chart was scribed for Kelly Simon by Kelly Simon, Scribe. This patient was seen in room 3 and the patient's care was started at 11:32 AM.  HPI Comments: Kelly Simon is a 48 y.o. female who presents to the [facility] for a diabetes check.  Pt was last seen by Kelly Simon on 05/02/13 and her hemoglobin A1C was 6.0.  She is a type II diabetic, but hasn't had to take medication for it.  In the past 2 days, she reports her sugars have been in the 400's and they normally are in the 80s. She states she checks her sugars 3 times a day.  Pt feels very weak and complaining of her tongue being dry and cracking.  She states her tongue feels swollen.  Pt states she urinated 4 times during the night, which she usually doesn't have to wake up throughout the night.  Pt also complains of a developing cough.  Pt states her cough is productive of yellow mucous.  She reports she is becoming hoarse.  Pt states she has nausea and chills.  Pt denies fever, sweats, HA, otalgia, diarrhea, emesis, and dysuria.  She had a flu shot around October.    Pt states she had a hysterectomy in 2005 for adenomyosis.  Pt states her mother passed away at the age of 47 from cervical cancer.  Pt 's mother had HTN, but no diabetes.  Pt's father has prostate cancer.  She reports her siblings are healthy.  Pt denies smoking.    Past Surgical History  Procedure Laterality Date  . Abdominal hysterectomy      ADENOMYOSIS; ovaries resected.    Family History  Problem Relation Age of Onset  . Cancer Mother     Cervical s/p hysterectomy; mets to the abdomen  . Hypertension Mother   . Cancer Father     prostate cancer    History   Social History  . Marital Status: Single    Spouse Name: Kelly Simon    Number of Children: 3  . Years of Education: 12+   Occupational History  . CUSTOMER  SERVICE    Social History Main Topics  . Smoking status: Former Smoker -- 26 years    Types: Cigarettes    Quit date: 04/03/2008  . Smokeless tobacco: Never Used  . Alcohol Use: No  . Drug Use: No  . Sexual Activity:    Partners: Male    Birth Control/ Protection: Surgical   Other Topics Concern  . Not on file   Social History Narrative   Engaged to be married.  Lives with him currently.  Two sons are grown and live independently.  The youngest is 26 and lives with her.    Allergies  Allergen Reactions  . Metformin And Related     diarrhea  . Adhesive [Tape] Rash    Patient Active Problem List   Diagnosis Date Noted  . 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 Left-sided low back pain with left-sided sciatica 06/14/2014  . Type II or unspecified type diabetes mellitus without mention of complication, uncontrolled 04/12/2014  . Anemia 05/02/2013  . BMI 29.0-29.9,adult 08/16/2012  . Depression with anxiety 07/12/2012    Review of Systems  Constitutional: Positive for chills and fatigue. Negative for fever.  HENT: Negative for congestion,  ear pain, rhinorrhea and sore throat.   Respiratory: Positive for cough. Negative for shortness of breath.   Cardiovascular: Negative for chest pain, palpitations and leg swelling.  Gastrointestinal: Negative for nausea, vomiting, abdominal pain and diarrhea.  Endocrine: Positive for polydipsia, polyphagia and polyuria.  Genitourinary: Positive for frequency.  Musculoskeletal: Positive for back pain. Negative for myalgias.  Skin: Negative for color change and rash.  Neurological: Positive for weakness. Negative for dizziness, syncope, light-headedness and headaches.  Psychiatric/Behavioral: Negative for behavioral problems and confusion.       Objective:   Physical Exam  Nursing note and vitals reviewed. Constitutional: She is oriented to person, place, and time. She appears well-developed  and well-nourished. No distress.  HENT:  Head: Normocephalic and atraumatic.  Right Ear: Tympanic membrane, external ear and ear canal normal.  Left Ear: Tympanic membrane, external ear and ear canal normal.  Nose: Rhinorrhea present. No mucosal edema. Right sinus exhibits no frontal sinus tenderness. Left sinus exhibits no frontal sinus tenderness.  Mouth/Throat: Uvula is midline and oropharynx is clear and moist. Mucous membranes are dry. No oropharyngeal exudate, posterior oropharyngeal edema or posterior oropharyngeal erythema.  White film over tongue.  Thrush present.    Eyes: Conjunctivae and EOM are normal. Right eye exhibits no discharge. Left eye exhibits no discharge.  Neck: Neck supple. No tracheal deviation present.  Cardiovascular: Normal rate, regular rhythm and normal heart sounds.  Exam reveals no gallop and no friction rub.   No murmur heard. Pulmonary/Chest: Effort normal. No respiratory distress. She has wheezes. She has no rales.  Bilateral expiratory wheezing anterior.  Scant wheezing posteriorly.    Abdominal: Soft. There is tenderness (mild) in the right lower quadrant. There is no rebound and no guarding.  Musculoskeletal: Normal range of motion.  Lymphadenopathy:    She has no cervical adenopathy.  Neurological: She is alert and oriented to person, place, and time.  Skin: Skin is warm and dry. No rash noted.  Psychiatric: She has a normal mood and affect. Her behavior is normal. Judgment and thought content normal.   Triage Vitals: BP 112/82  Pulse 80  Temp(Src) 98 F (36.7 C) (Oral)  Resp 16  Ht 5' 2.5" (1.588 m)  Wt 182 lb 12.8 oz (82.918 kg)  BMI 32.88 kg/m2  SpO2 98%  [facility] reading (PRIMARY) by  Kelly Simon. CXR: No acute disease.   Results for orders placed or performed in visit on 11/15/13  Urine culture  Result Value Ref Range   Culture ESCHERICHIA COLI    Colony Count >=100,000 COLONIES/ML    Organism ID, Bacteria ESCHERICHIA COLI        Susceptibility   Escherichia coli -  (no method available)    AMPICILLIN 4 Sensitive     AMOX/CLAVULANIC 4 Sensitive     AMPICILLIN/SULBACTAM <=2 Sensitive     PIP/TAZO <=4 Sensitive     IMIPENEM <=0.25 Sensitive     CEFAZOLIN <=4 Sensitive     CEFTRIAXONE <=1 Sensitive     CEFTAZIDIME <=1 Sensitive     CEFEPIME <=1 Sensitive     GENTAMICIN <=1 Sensitive     TOBRAMYCIN <=1 Sensitive     CIPROFLOXACIN <=0.25 Sensitive     LEVOFLOXACIN <=0.12 Sensitive     NITROFURANTOIN 32 Sensitive     TRIMETH/SULFA <=20 Sensitive   Comprehensive metabolic panel  Result Value Ref Range   Sodium 141 135 - 145 mEq/L   Potassium 4.4 3.5 - 5.3 mEq/L   Chloride 103 96 -  112 mEq/L   CO2 24 19 - 32 mEq/L   Glucose, Bld 361 (H) 70 - 99 mg/dL   BUN 7 6 - 23 mg/dL   Creat 1.61 0.96 - 0.45 mg/dL   Total Bilirubin 0.5 0.2 - 1.2 mg/dL   Alkaline Phosphatase 125 (H) 39 - 117 U/L   AST 11 0 - 37 U/L   ALT 11 0 - 35 U/L   Total Protein 6.8 6.0 - 8.3 g/dL   Albumin 4.0 3.5 - 5.2 g/dL   Calcium 9.5 8.4 - 40.9 mg/dL  Microalbumin, urine  Result Value Ref Range   Microalb, Ur 0.62 0.00 - 1.89 mg/dL  POCT CBC  Result Value Ref Range   WBC 8.7 4.6 - 10.2 K/uL   Lymph, poc 2.8 0.6 - 3.4   POC LYMPH PERCENT 32.6 10 - 50 %L   MID (cbc) 0.5 0 - 0.9   POC MID % 6.0 0 - 12 %M   POC Granulocyte 5.3 2 - 6.9   Granulocyte percent 61.4 37 - 80 %G   RBC 5.26 4.04 - 5.48 M/uL   Hemoglobin 14.6 12.2 - 16.2 g/dL   HCT, POC 81.1 91.4 - 47.9 %   MCV 88.8 80 - 97 fL   MCH, POC 27.8 27 - 31.2 pg   MCHC 31.3 (A) 31.8 - 35.4 g/dL   RDW, POC 78.2 %   Platelet Count, POC 303 142 - 424 K/uL   MPV 9.3 0 - 99.8 fL  POCT Influenza A/B  Result Value Ref Range   Influenza A, POC Negative    Influenza B, POC Negative   POCT glucose (manual entry)  Result Value Ref Range   POC Glucose 414 (A) 70 - 99 mg/dl  POCT UA - Microscopic Only  Result Value Ref Range   WBC, Ur, HPF, POC 2-5    RBC, urine, microscopic 0-1     Bacteria, U Microscopic 1+    Mucus, UA neg    Epithelial cells, urine per micros 0-5    Crystals, Ur, HPF, POC neg    Casts, Ur, LPF, POC neg    Yeast, UA positive   POCT urinalysis dipstick  Result Value Ref Range   Color, UA yellow    Clarity, UA clear    Glucose, UA 500    Bilirubin, UA neg    Ketones, UA 8.0    Spec Grav, UA 1.015    Blood, UA trace-lysed    pH, UA 5.5    Protein, UA neg    Urobilinogen, UA 0.2    Nitrite, UA positive    Leukocytes, UA Negative   POCT glycosylated hemoglobin (Hb A1C)  Result Value Ref Range   Hemoglobin A1C >14.0    ALBUTEROL NEBULIZER ADMINISTERED IN OFFICE.    Assessment & Plan:  Cough - Plan: POCT CBC, POCT Influenza A/B, POCT glucose (manual entry), POCT UA - Microscopic Only, POCT urinalysis dipstick, Comprehensive metabolic panel, Microalbumin, urine, DG Chest 2 View, albuterol (PROVENTIL) (2.5 MG/3ML) 0.083% nebulizer solution 2.5 mg, Urine culture  Bronchospasm - Plan: POCT CBC, POCT Influenza A/B, POCT glucose (manual entry), POCT UA - Microscopic Only, POCT urinalysis dipstick, Comprehensive metabolic panel, Microalbumin, urine, DG Chest 2 View, albuterol (PROVENTIL) (2.5 MG/3ML) 0.083% nebulizer solution 2.5 mg, Urine culture  Type II or unspecified type diabetes mellitus without mention of complication, uncontrolled - Plan: POCT CBC, POCT Influenza A/B, POCT glucose (manual entry), POCT UA - Microscopic Only, POCT urinalysis dipstick, Comprehensive metabolic panel,  Microalbumin, urine, DG Chest 2 View, POCT glycosylated hemoglobin (Hb A1C), Urine culture  Thrush - Plan: Urine culture  Acute bronchitis - Plan: albuterol (PROVENTIL) (2.5 MG/3ML) 0.083% nebulizer solution 2.5 mg, Urine culture  UTI (urinary tract infection), uncomplicated   1. DMII: uncontrolled; rx for Januvia 50mg  daily provided; rx for Glucotrol 5mg  bid provided as well.  Intolerant to Metformin (diarrhea) thus did not prescribe.  Close follow-up with Dr.  Merla Simon advised.   2.  Acute bronchitis:  New.  Rx for zithromax provided.  Recommend Mucinex DM bid. 3. Bronchospasm: New  S/p Albuterol nebulizer in the office.   4.  Thrush: New. Rx for Nystatin swish and swallow provided; rx for Diflucan also prescribed. 5. UTI: New. Rx for Cipro added when urine culture returned.  Diflucan also prescribed due to yeast in urine.   Meds ordered this encounter  Medications  . albuterol (PROVENTIL) (2.5 MG/3ML) 0.083% nebulizer solution 2.5 mg    Sig:   . DISCONTD: glipiZIDE (GLUCOTROL) 5 MG tablet    Sig: Take 1 tablet (5 mg total) by mouth 2 (two) times daily before a meal.    Dispense:  60 tablet    Refill:  3  . DISCONTD: sitaGLIPtin (JANUVIA) 50 MG tablet    Sig: Take 1 tablet (50 mg total) by mouth daily.    Dispense:  30 tablet    Refill:  3  . DISCONTD: azithromycin (ZITHROMAX) 250 MG tablet    Sig: Take 2 tabs PO x 1 dose, then 1 tab PO QD x 4 days    Dispense:  6 tablet    Refill:  0  . DISCONTD: fluconazole (DIFLUCAN) 150 MG tablet    Sig: Take 1 tablet (150 mg total) by mouth once. Repeat if needed    Dispense:  2 tablet    Refill:  0  . DISCONTD: nystatin (MYCOSTATIN) 100000 UNIT/ML suspension    Sig: Take 5 mLs (500,000 Units total) by mouth 4 (four) times daily.    Dispense:  200 mL    Refill:  0  . DISCONTD: ciprofloxacin (CIPRO) 500 MG tablet    Sig: Take 1 tablet (500 mg total) by mouth 2 (two) times daily.    Dispense:  14 tablet    Refill:  0    I personally performed the services described in this documentation, which was scribed in my presence.  The recorded information has been reviewed and is accurate.  Nilda Simmer, M.D.  [facility] [address] [place], [place]  [address] [phone] phone [phone] fax

## 2013-11-17 LAB — URINE CULTURE

## 2013-11-17 MED ORDER — CIPROFLOXACIN HCL 500 MG PO TABS: 500.0000 mg | ORAL_TABLET | Freq: Two times a day (BID) | ORAL | Status: DC

## 2013-11-20 ENCOUNTER — Telehealth: Payer: Self-pay

## 2013-11-20 MED ORDER — NITROFURANTOIN MONOHYD MACRO 100 MG PO CAPS: 100.0000 mg | ORAL_CAPSULE | Freq: Two times a day (BID) | ORAL | Status: DC

## 2013-11-20 NOTE — Telephone Encounter (Signed)
Stop Cipro.  I have sent a new antibiotic to her pharmacy.  Take the full course as directed.  If worsening or not improving, RTC

## 2013-11-20 NOTE — Telephone Encounter (Signed)
Called patient about labs.  Patient states she is having trouble side effects from the Cipro.  C/O nausea, diarrhea and chills.  Please advise

## 2013-11-20 NOTE — Telephone Encounter (Signed)
Patient advised, she will stop Cipro and start new abx.  She will call or RTC if no improvement or worse.

## 2013-11-22 ENCOUNTER — Telehealth: Payer: Self-pay | Admitting: Internal Medicine

## 2013-11-22 NOTE — Telephone Encounter (Signed)
Called and left patient a vm to give us a call back and confirm if she can come in on 12/26/13 @9 :45am To see Doctor Merla Richesoolittle

## 2013-12-12 ENCOUNTER — Ambulatory Visit: Payer: BC Managed Care – PPO | Admitting: Internal Medicine

## 2013-12-19 ENCOUNTER — Encounter: Payer: Self-pay | Admitting: Internal Medicine

## 2013-12-26 ENCOUNTER — Ambulatory Visit: Payer: BC Managed Care – PPO | Admitting: Internal Medicine

## 2014-01-16 ENCOUNTER — Ambulatory Visit: Payer: BC Managed Care – PPO | Admitting: Internal Medicine

## 2014-04-03 LAB — HM DIABETES EYE EXAM

## 2014-04-12 ENCOUNTER — Emergency Department (HOSPITAL_COMMUNITY)
Admission: EM | Admit: 2014-04-12 | Discharge: 2014-04-12 | Disposition: A | Discharge status: Home / Self Care | Payer: BC Managed Care – PPO | Attending: Emergency Medicine | Admitting: Emergency Medicine

## 2014-04-12 ENCOUNTER — Encounter (HOSPITAL_COMMUNITY): Payer: Self-pay | Admitting: Emergency Medicine

## 2014-04-12 ENCOUNTER — Ambulatory Visit (INDEPENDENT_AMBULATORY_CARE_PROVIDER_SITE_OTHER): Payer: BC Managed Care – PPO | Admitting: Family Medicine

## 2014-04-12 VITALS — BP 120/75 | HR 79 | Temp 98.6°F | Resp 24 | Ht 60.5 in | Wt 187.0 lb

## 2014-04-12 DIAGNOSIS — Z8659 Personal history of other mental and behavioral disorders: Secondary | ICD-10-CM | POA: Insufficient documentation

## 2014-04-12 DIAGNOSIS — Z87891 Personal history of nicotine dependence: Secondary | ICD-10-CM | POA: Insufficient documentation

## 2014-04-12 DIAGNOSIS — R631 Polydipsia: Secondary | ICD-10-CM

## 2014-04-12 DIAGNOSIS — E1110 Type 2 diabetes mellitus with ketoacidosis without coma: Secondary | ICD-10-CM

## 2014-04-12 DIAGNOSIS — R739 Hyperglycemia, unspecified: Secondary | ICD-10-CM

## 2014-04-12 DIAGNOSIS — R358 Other polyuria: Secondary | ICD-10-CM

## 2014-04-12 DIAGNOSIS — R3589 Other polyuria: Secondary | ICD-10-CM

## 2014-04-12 DIAGNOSIS — K117 Disturbances of salivary secretion: Secondary | ICD-10-CM

## 2014-04-12 DIAGNOSIS — E119 Type 2 diabetes mellitus without complications: Secondary | ICD-10-CM | POA: Insufficient documentation

## 2014-04-12 DIAGNOSIS — R7309 Other abnormal glucose: Secondary | ICD-10-CM

## 2014-04-12 DIAGNOSIS — E1165 Type 2 diabetes mellitus with hyperglycemia: Secondary | ICD-10-CM | POA: Insufficient documentation

## 2014-04-12 DIAGNOSIS — R682 Dry mouth, unspecified: Secondary | ICD-10-CM

## 2014-04-12 DIAGNOSIS — E1310 Other specified diabetes mellitus with ketoacidosis without coma: Secondary | ICD-10-CM

## 2014-04-12 DIAGNOSIS — IMO0001 Reserved for inherently not codable concepts without codable children: Secondary | ICD-10-CM

## 2014-04-12 LAB — POCT URINALYSIS DIPSTICK
Bilirubin, UA: NEGATIVE
GLUCOSE UA: 500
Leukocytes, UA: NEGATIVE
Nitrite, UA: POSITIVE
PH UA: 5.5
Protein, UA: NEGATIVE
Spec Grav, UA: <=1.005
Urobilinogen, UA: 0.2

## 2014-04-12 LAB — CBC
HCT: 42.6 % (ref 36.0–46.0)
Hemoglobin: 14.6 g/dL (ref 12.0–15.0)
MCH: 28.1 pg (ref 26.0–34.0)
MCHC: 34.3 g/dL (ref 30.0–36.0)
MCV: 82.1 fL (ref 78.0–100.0)
Platelets: 298 10*3/uL (ref 150–400)
RBC: 5.19 MIL/uL — ABNORMAL HIGH (ref 3.87–5.11)
RDW: 13.9 % (ref 11.5–15.5)
WBC: 8.6 10*3/uL (ref 4.0–10.5)

## 2014-04-12 LAB — POCT UA - MICROSCOPIC ONLY
CASTS, UR, LPF, POC: NEGATIVE
CRYSTALS, UR, HPF, POC: NEGATIVE
MUCUS UA: NEGATIVE
Yeast, UA: NEGATIVE

## 2014-04-12 LAB — COMPREHENSIVE METABOLIC PANEL
ALBUMIN: 4.3 g/dL (ref 3.5–5.2)
ALT: 16 U/L (ref 0–35)
ALT: 17 U/L (ref 0–35)
AST: 12 U/L (ref 0–37)
AST: 19 U/L (ref 0–37)
Albumin: 4 g/dL (ref 3.5–5.2)
Alkaline Phosphatase: 130 U/L — ABNORMAL HIGH (ref 39–117)
Alkaline Phosphatase: 130 U/L — ABNORMAL HIGH (ref 39–117)
Anion gap: 20 — ABNORMAL HIGH (ref 5–15)
BILIRUBIN TOTAL: 0.3 mg/dL (ref 0.3–1.2)
BUN: 10 mg/dL (ref 6–23)
BUN: 9 mg/dL (ref 6–23)
CALCIUM: 9.4 mg/dL (ref 8.4–10.5)
CHLORIDE: 101 meq/L (ref 96–112)
CHLORIDE: 100 meq/L (ref 96–112)
CO2: 21 mEq/L (ref 19–32)
CO2: 21 meq/L (ref 19–32)
CREATININE: 0.59 mg/dL (ref 0.50–1.10)
Calcium: 9.6 mg/dL (ref 8.4–10.5)
Creat: 0.79 mg/dL (ref 0.50–1.10)
GFR calc Af Amer: >90 mL/min (ref >90)
GLUCOSE: 371 mg/dL — AB (ref 70–99)
Glucose, Bld: 218 mg/dL — ABNORMAL HIGH (ref 70–99)
Potassium: 3.9 mEq/L (ref 3.5–5.3)
Potassium: 3.9 mEq/L (ref 3.7–5.3)
Sodium: 137 mEq/L (ref 135–145)
Sodium: 141 mEq/L (ref 137–147)
Total Bilirubin: 0.5 mg/dL (ref 0.2–1.2)
Total Protein: 7.3 g/dL (ref 6.0–8.3)
Total Protein: 7.6 g/dL (ref 6.0–8.3)

## 2014-04-12 LAB — CBG MONITORING, ED
Glucose-Capillary: 238 mg/dL — ABNORMAL HIGH (ref 70–99)
Glucose-Capillary: 215 mg/dL — ABNORMAL HIGH (ref 70–99)

## 2014-04-12 LAB — GLUCOSE, POCT (MANUAL RESULT ENTRY): POC Glucose: 420 mg/dl — AB (ref 70–99)

## 2014-04-12 LAB — POCT GLYCOSYLATED HEMOGLOBIN (HGB A1C): Hemoglobin A1C: >14

## 2014-04-12 MED ORDER — INSULIN ASPART 100 UNIT/ML ~~LOC~~ SOLN
20.0000 [IU] | Freq: Once | SUBCUTANEOUS | Status: AC
  Administered 2014-04-12: 20 [IU] via SUBCUTANEOUS

## 2014-04-12 MED ORDER — SODIUM CHLORIDE 0.9 % IV BOLUS (SEPSIS)
1000.0000 mL | Freq: Once | INTRAVENOUS | Status: AC
  Administered 2014-04-12: 1000 mL via INTRAVENOUS

## 2014-04-12 NOTE — ED Notes (Signed)
CBG:239 

## 2014-04-12 NOTE — ED Provider Notes (Signed)
CSN: [id number]     Arrival date & time 04/12/14  1300 History   First MD Initiated Contact with Patient 04/12/14 1555     Chief Complaint  Patient presents with  . Hyperglycemia     (Consider location/radiation/quality/duration/timing/severity/associated sxs/prior Treatment) HPI Comments: Patient is a 48 year old female with history of DM.  She presents for eval of elevated sugars.  They have been running high for the past 3 weeks.  She went to see her pcp and sugar was over 400 and there were ketones in her urine.  She was sent for evaluation of possible DKA.  She denies any changes in her medications and denies any changes in her diet.  No vomiting or diarrhea.  No chest or abd pain.  Patient is a 48 y.o. female presenting with hyperglycemia. The history is provided by the patient.  Hyperglycemia Severity:  Moderate Onset quality:  Gradual Duration:  3 weeks Timing:  Constant Progression:  Worsening Chronicity:  New Diabetes status:  Controlled with oral medications   Past Medical History  Diagnosis Date  . Diabetes mellitus without complication   . Depression    Past Surgical History  Procedure Laterality Date  . Abdominal hysterectomy      ADENOMYOSIS; ovaries resected.   Family History  Problem Relation Age of Onset  . Cancer Mother     Cervical s/p hysterectomy; mets to the abdomen  . Hypertension Mother   . Cancer Father     prostate cancer   History  Substance Use Topics  . Smoking status: Former Smoker -- 26 years    Types: Cigarettes    Quit date: 04/03/2008  . Smokeless tobacco: Never Used  . Alcohol Use: No   OB History   Grav Para Term Preterm Abortions TAB SAB Ect Mult Living                 Review of Systems  All other systems reviewed and are negative.     Allergies  Metformin and related and Adhesive  Home Medications   Prior to Admission medications   Not on File   BP 111/66  Pulse 84  Temp(Src) 98.1 F (36.7 C) (Oral)  Resp  16  SpO2 100% Physical Exam  Nursing note and vitals reviewed. Constitutional: She is oriented to person, place, and time. She appears well-developed and well-nourished. No distress.  HENT:  Head: Normocephalic and atraumatic.  Neck: Normal range of motion. Neck supple.  Cardiovascular: Normal rate and regular rhythm.  Exam reveals no gallop and no friction rub.   No murmur heard. Pulmonary/Chest: Effort normal and breath sounds normal. No respiratory distress. She has no wheezes.  Abdominal: Soft. Bowel sounds are normal. She exhibits no distension. There is no tenderness.  Musculoskeletal: Normal range of motion.  Neurological: She is alert and oriented to person, place, and time. No cranial nerve deficit. Coordination normal.  Skin: Skin is warm and dry. She is not diaphoretic.    ED Course  Procedures (including critical care time) Labs Review Labs Reviewed  COMPREHENSIVE METABOLIC PANEL - Abnormal; Notable for the following:    Glucose, Bld 218 (*)    Alkaline Phosphatase 130 (*)    Anion gap 20 (*)    All other components within normal limits  CBC - Abnormal; Notable for the following:    RBC 5.19 (*)    All other components within normal limits  CBG MONITORING, ED - Abnormal; Notable for the following:    Glucose-Capillary  238 (*)    All other components within normal limits    Imaging Review No results found.   EKG Interpretation None      MDM   Final diagnoses:  None    Patient sent for eval of high blood sugar, ketones in urine.  She was given novolog by pcp prior to coming here.  Her sugars are in the low 200's and she appears clinically well.  She was given ns and appears well.  Her CO2 is normal, however her anion gap is 20 prior to fluids.  I feel as though she is appropriate for discharge.  She is to follow her sugars this weekend and keep a record of these so that she can discuss with her pcp next week and make the appropriate changes in her  medications.    Geoffery Lyons, MD 04/12/14 [phone]

## 2014-04-12 NOTE — Progress Notes (Signed)
Subjective: Patient ran out of her diabetic medications about 6 weeks ago" I haven't had a chance to get them renewed." She works a [employer]. 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 She does try and walk one half hour every day. Stressed watch her diet. She checks her sugars at home and when she was on the medication ran in the 80s and 90s and low 100 range. She apparently was on metformin once years ago and didn't tolerate it diarrhea. She has been thirsty a lot. Her sugar was over 400 and home this morning. Her mouth has felt dry and sticky. She has been thirsty. She's had to urinate frequently. They're to objective:   Objective: Pleasant lady in no major acute distress. Throat has dry mucous membranes with prominent papilla on her tongue. Neck supple. Chest clear. Heart regular without murmurs. No ankle edema.  Results for orders placed in visit on 04/12/14  GLUCOSE, POCT (MANUAL RESULT ENTRY)      Result Value Ref Range   POC Glucose 420 (*) 70 - 99 mg/dl  POCT GLYCOSYLATED HEMOGLOBIN (HGB A1C)      Result Value Ref Range   Hemoglobin A1C >14.0    POCT URINALYSIS DIPSTICK      Result Value Ref Range   Color, UA light yellow     Clarity, UA slightly cloudy     Glucose, UA 500     Bilirubin, UA neg     Ketones, UA >=160     Spec Grav, UA <=1.005     Blood, UA trace-intact     pH, UA 5.5     Protein, UA neg     Urobilinogen, UA 0.2     Nitrite, UA positive     Leukocytes, UA Negative    POCT UA - MICROSCOPIC ONLY      Result Value Ref Range   WBC, Ur, HPF, POC 1-3     RBC, urine, microscopic 10-12     Bacteria, U Microscopic 1+     Mucus, UA neg     Epithelial cells, urine per micros 1-3     Crystals, Ur, HPF, POC neg     Casts, Ur, LPF, POC neg     Yeast, UA neg     Assessment: Controlled diabetes mellitus with ketosis  Plan: Send emergency room. She will need IV fluids and acute lab testing to determine her carbonate and pH.  Patient given 20 units in the  office.

## 2014-04-12 NOTE — ED Notes (Addendum)
Pt has been out of her blood pressure medication since May. A1C at MD office was greater than 14. Pts CBG 420 at MDs office and was given 20 of novolog pta. Pt reports large amount of stress in life recently. Pt reports polyuria and polydipsia. UA at MD office report large amount of ketones.

## 2014-04-12 NOTE — Discharge Instructions (Signed)
Continue your medications as before.  Keep a record of your sugars and take this with you when you followup with your doctor next week.   Hyperglycemia Hyperglycemia occurs when the glucose (sugar) in your blood is too high. Hyperglycemia can happen for many reasons, but it most often happens to people who do not know they have diabetes or are not managing their diabetes properly.  CAUSES  Whether you have diabetes or not, there are other causes of hyperglycemia. Hyperglycemia can occur when you have diabetes, but it can also occur in other situations that you might not be as aware of, such as: Diabetes  If you have diabetes and are having problems controlling your blood glucose, hyperglycemia could occur because of some of the following reasons:  Not following your meal plan.  Not taking your diabetes medications or not taking it properly.  Exercising less or doing less activity than you normally do.  Being sick. Pre-diabetes  This cannot be ignored. Before people develop Type 2 diabetes, they almost always have "pre-diabetes." This is when your blood glucose levels are higher than normal, but not yet high enough to be diagnosed as diabetes. Research has shown that some long-term damage to the body, especially the heart and circulatory system, may already be occurring during pre-diabetes. If you take action to manage your blood glucose when you have pre-diabetes, you may delay or prevent Type 2 diabetes from developing. Stress  If you have diabetes, you may be "diet" controlled or on oral medications or insulin to control your diabetes. However, you may find that your blood glucose is higher than usual in the hospital whether you have diabetes or not. This is often referred to as "stress hyperglycemia." Stress can elevate your blood glucose. This happens because of hormones put out by the body during times of stress. If stress has been the cause of your high blood glucose, it can be followed  regularly by your caregiver. That way he/she can make sure your hyperglycemia does not continue to get worse or progress to diabetes. Steroids  Steroids are medications that act on the infection fighting system (immune system) to block inflammation or infection. One side effect can be a rise in blood glucose. Most people can produce enough extra insulin to allow for this rise, but for those who cannot, steroids make blood glucose levels go even higher. It is not unusual for steroid treatments to "uncover" diabetes that is developing. It is not always possible to determine if the hyperglycemia will go away after the steroids are stopped. A special blood test called an A1c is sometimes done to determine if your blood glucose was elevated before the steroids were started. SYMPTOMS  Thirsty.  Frequent urination.  Dry mouth.  Blurred vision.  Tired or fatigue.  Weakness.  Sleepy.  Tingling in feet or leg. DIAGNOSIS  Diagnosis is made by monitoring blood glucose in one or all of the following ways:  A1c test. This is a chemical found in your blood.  Fingerstick blood glucose monitoring.  Laboratory results. TREATMENT  First, knowing the cause of the hyperglycemia is important before the hyperglycemia can be treated. Treatment may include, but is not be limited to:  Education.  Change or adjustment in medications.  Change or adjustment in meal plan.  Treatment for an illness, infection, etc.  More frequent blood glucose monitoring.  Change in exercise plan.  Decreasing or stopping steroids.  Lifestyle changes. HOME CARE INSTRUCTIONS   Test your blood glucose as  directed.  Exercise regularly. Your caregiver will give you instructions about exercise. Pre-diabetes or diabetes which comes on with stress is helped by exercising.  Eat wholesome, balanced meals. Eat often and at regular, fixed times. Your caregiver or nutritionist will give you a meal plan to guide your sugar  intake.  Being at an ideal weight is important. If needed, losing as little as 10 to 15 pounds may help improve blood glucose levels. SEEK MEDICAL CARE IF:   You have questions about medicine, activity, or diet.  You continue to have symptoms (problems such as increased thirst, urination, or weight gain). SEEK IMMEDIATE MEDICAL CARE IF:   You are vomiting or have diarrhea.  Your breath smells fruity.  You are breathing faster or slower.  You are very sleepy or incoherent.  You have numbness, tingling, or pain in your feet or hands.  You have chest pain.  Your symptoms get worse even though you have been following your caregiver's orders.  If you have any other questions or concerns. Document Released: 03/16/2001 Document Revised: 12/13/2011 Document Reviewed: 01/17/2012 [facility] Patient Information 2015 [employer] [place]. This information is not intended to replace advice given to you by your health care provider. Make sure you discuss any questions you have with your health care provider.

## 2014-04-12 NOTE — Patient Instructions (Signed)
[facility] [address] Tell the triage nurse that I have spoken to them about the need for acute assessment and treatment.  Drink plenty of water.

## 2014-04-13 ENCOUNTER — Ambulatory Visit (INDEPENDENT_AMBULATORY_CARE_PROVIDER_SITE_OTHER): Payer: BC Managed Care – PPO | Admitting: Family Medicine

## 2014-04-13 ENCOUNTER — Telehealth: Payer: Self-pay

## 2014-04-13 VITALS — BP 110/70 | HR 68 | Temp 97.9°F | Resp 16 | Ht 60.5 in | Wt 187.0 lb

## 2014-04-13 DIAGNOSIS — R062 Wheezing: Secondary | ICD-10-CM

## 2014-04-13 DIAGNOSIS — IMO0001 Reserved for inherently not codable concepts without codable children: Secondary | ICD-10-CM

## 2014-04-13 DIAGNOSIS — E1165 Type 2 diabetes mellitus with hyperglycemia: Principal | ICD-10-CM

## 2014-04-13 MED ORDER — SITAGLIPTIN PHOSPHATE 50 MG PO TABS: 50.0000 mg | ORAL_TABLET | Freq: Once | ORAL | Status: DC

## 2014-04-13 MED ORDER — ALBUTEROL SULFATE HFA 108 (90 BASE) MCG/ACT IN AERS: 2.0000 | INHALATION_SPRAY | Freq: Four times a day (QID) | RESPIRATORY_TRACT | Status: DC | PRN

## 2014-04-13 MED ORDER — GLIPIZIDE 5 MG PO TABS: 5.0000 mg | ORAL_TABLET | Freq: Two times a day (BID) | ORAL | Status: DC

## 2014-04-13 NOTE — Telephone Encounter (Signed)
Patient called stated she was seen by Dr. Alwyn RenHopper yesterday and was told she had diabetes. Per patient Dr. Alwyn RenHopper sent her over to the hospital. Patient stated the hospital did not prescribe her any medication. Patient requested the days Dr. Alwyn RenHopper would be working.  Patient not sure if she will need to come in for another appointment to get medication. Please contact patient at [phone]

## 2014-04-13 NOTE — Progress Notes (Signed)
Subjective: Patient was here yesterday with very and controlled diabetes. She was sent to the emergency room where she received fluids. Her numbers were coming back into line and she was sent home, to come back in here today and get her medications adjusted. She also was a little queasy yesterday. She's not been having a cough. She has no history of asthma.  Objective: She has soft wheezing or more the left lung the right. Throat still is a little dry and tongue has prominent papilla, not so much yesterday. Heart regular without murmurs.  Assessment: Uncontrolled diabetes Type II Wheezing, probably mild asthma  Plan:  Albuterol inhaler Glipizide 5 mg twice a day Januvia 50 daily

## 2014-04-13 NOTE — Patient Instructions (Signed)
Resume your glipizide 5 mg one twice daily at breakfast and supper.  Resume your Januvia 50 mg one each morning  Monitor the glucose fasting and 2 hours after your main meal and as needed if you suspect your sugar may be low. In the event that the sugar falls below and you develop weakness and shakiness and sweating and dizziness take some sugar replacement. Keep a record of her readings from your One Touch II and bring that in with you at your visit  If you get frequent readings below 80 the glipizide dose will need to be decreased so please return.

## 2014-04-16 NOTE — Telephone Encounter (Signed)
Patient came n

## 2014-04-29 ENCOUNTER — Encounter: Payer: Self-pay | Admitting: Internal Medicine

## 2014-04-29 ENCOUNTER — Ambulatory Visit (INDEPENDENT_AMBULATORY_CARE_PROVIDER_SITE_OTHER): Payer: BC Managed Care – PPO | Admitting: Internal Medicine

## 2014-04-29 VITALS — BP 114/64 | HR 89 | Temp 98.2°F | Resp 12 | Ht 64.5 in | Wt 190.0 lb

## 2014-04-29 DIAGNOSIS — IMO0001 Reserved for inherently not codable concepts without codable children: Secondary | ICD-10-CM

## 2014-04-29 DIAGNOSIS — E1165 Type 2 diabetes mellitus with hyperglycemia: Principal | ICD-10-CM

## 2014-04-29 MED ORDER — METFORMIN HCL ER 500 MG PO TB24: 1000.0000 mg | ORAL_TABLET | Freq: Two times a day (BID) | ORAL | Status: DC

## 2014-04-29 NOTE — Patient Instructions (Signed)
Please start Metformin 500 mg with dinner x 4 days. If you tolerate this well, add another Metformin tablet (500 mg) with breakfast x 4 days. If you tolerate this well, add another metformin tablet with dinner (total 1000 mg) x 4 days. If you tolerate this well, add another metformin tablet with breakfast (total 1000 mg). Continue with 1000 mg of metformin twice a day with breakfast and dinner. Continue Januvia 100 mg in am. Continue Glipizide 5 mg 2x a day before breakfast and dinner.  Please return in 1 month with your sugar log.   PATIENT INSTRUCTIONS FOR TYPE 2 DIABETES:  **Please join MyChart!** - see attached instructions about how to join if you have not done so already.  DIET AND EXERCISE Diet and exercise is an important part of diabetic treatment.  We recommended aerobic exercise in the form of brisk walking (working between 40-60% of maximal aerobic capacity, similar to brisk walking) for 150 minutes per week (such as 30 minutes five days per week) along with 3 times per week performing 'resistance' training (using various gauge rubber tubes with handles) 5-10 exercises involving the major muscle groups (upper body, lower body and core) performing 10-15 repetitions (or near fatigue) each exercise. Start at half the above goal but build slowly to reach the above goals. If limited by weight, joint pain, or disability, we recommend daily walking in a swimming pool with water up to waist to reduce pressure from joints while allow for adequate exercise.    BLOOD GLUCOSES Monitoring your blood glucoses is important for continued management of your diabetes. Please check your blood glucoses 2-4 times a day: fasting, before meals and at bedtime (you can rotate these measurements - e.g. one day check before the 3 meals, the next day check before 2 of the meals and before bedtime, etc.).   HYPOGLYCEMIA (low blood sugar) Hypoglycemia is usually a reaction to not eating, exercising, or taking too  much insulin/ other diabetes drugs.  Symptoms include tremors, sweating, hunger, confusion, headache, etc. Treat IMMEDIATELY with 15 grams of Carbs:   4 glucose tablets    cup regular juice/soda   2 tablespoons raisins   4 teaspoons sugar   1 tablespoon honey Recheck blood glucose in 15 mins and repeat above if still symptomatic/blood glucose <100.  RECOMMENDATIONS TO REDUCE YOUR RISK OF DIABETIC COMPLICATIONS: * Take your prescribed MEDICATION(S) * Follow a DIABETIC diet: Complex carbs, fiber rich foods, (monounsaturated and polyunsaturated) fats * AVOID saturated/trans fats, high fat foods, >2,300 mg salt per day. * EXERCISE at least 5 times a week for 30 minutes or preferably daily.  * DO NOT SMOKE OR DRINK more than 1 drink a day. * Check your FEET every day. Do not wear tightfitting shoes. Contact us if you develop an ulcer * See your EYE doctor once a year or more if needed * Get a FLU shot once a year * Get a PNEUMONIA vaccine once before and once after age 265 years  GOALS:  * Your Hemoglobin A1c of <7%  * fasting sugars need to be <130 * after meals sugars need to be <180 (2h after you start eating) * Your Systolic BP should be 140 or lower  * Your Diastolic BP should be 80 or lower  * Your HDL (Good Cholesterol) should be 40 or higher  * Your LDL (Bad Cholesterol) should be 100 or lower. * Your Triglycerides should be 150 or lower  * Your Urine microalbumin (kidney function) should be <  30 * Your Body Mass Index should be 25 or lower   We will be glad to help you achieve these goals. Our telephone number is: [phone].

## 2014-04-29 NOTE — Progress Notes (Signed)
Patient ID: Kelly Simon, female   DOB: 01/16/1966, 48 y.o.   MRN: [id number]  HPI: Kelly Simon is a 48 y.o.-year-old female, referred by her PCP, Dr. Alwyn RenHopper, for management of DM2, non-insulin-dependent, uncontrolled, without complications. PCP: Dr Merla Richesoolittle  Patient has been diagnosed with diabetes in 2006; she has been on insulin before, right after dx. Last hemoglobin A1c was: Lab Results  Component Value Date   HGBA1C >14.0 04/12/2014   HGBA1C >14.0 11/15/2013   HGBA1C 6.0 05/02/2013   She lost both patents in the last year. She was taken off her diabetes meds last year as her A1c was great. She was restarted on them, then ran out and could not refill them >> sugars got out of control (in the 400s).  Pt is on a regimen of - started back 04/13/2014: - Glipizide 5 mg bid - Januvia 100 mg in am She had problems with Metformin - tried this in 2011: abdominal cramps and diarrhea. She only tried this for 1 day when she took all 4 tablets.  Pt checks her sugars 3-4 a day and they are: - am: 250-268 - 2.5 h after b'fast: 250-260 - before lunch: n/c - 2h after lunch: 260s - before dinner: 260s - 2h after dinner: n/c - bedtime: n/c No lows. Lowest sugar was 251; she has hypoglycemia awareness at 60s.  Highest sugar was 305.  [place] - commutes 2h each way - leaves home at 8 am and comes back home at 12 am.  She lost 139 lbs - increased exercise and water intake - but had a more normal schedule at that time. Since she started this job she started to gain weight back.  He walks for exercise 30 min a day.  Pt's meals are: - Breakfast: yoghurt or bowl of grits/coffee/banana - Lunch + Dinner: grilled chicken salad - Snacks: veggies, bananas, oranges, crackers; no sodas  - no CKD, last BUN/creatinine:  Lab Results  Component Value Date   BUN 9 04/12/2014   CREATININE 0.59 04/12/2014  She is not on an ACEI/ARB. - last set of lipids: Lab Results   Component Value Date   CHOL 177 07/12/2012   HDL 42 07/12/2012   LDLCALC 104* 07/12/2012   TRIG 155* 07/12/2012   CHOLHDL 4.2 07/12/2012  She is not on a statin. - last eye exam was in 10/2013. No DR. Has cataracts.  - + numbness, but no tingling in her feet.  Pt has no FH of DM. Heart ds in mother and MGM.   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 ROS: Constitutional: + weight gain, + increased appetite, no fatigue, no subjective hyperthermia/hypothermia Eyes: + blurry vision, no xerophthalmia ENT: no sore throat, no nodules palpated in throat, + occasional  dysphagia/odynophagia, no hoarseness Cardiovascular: no CP/SOB/palpitations/leg swelling Respiratory: no cough/SOB Gastrointestinal: no N/V/D/C Musculoskeletal: no muscle/joint aches Skin: no rashes Neurological: no tremors/numbness/tingling/dizziness Psychiatric: no depression/anxiety  Past Medical History  Diagnosis Date  . Diabetes mellitus without complication   . Depression    Past Surgical History  Procedure Laterality Date  . Abdominal hysterectomy      ADENOMYOSIS; ovaries resected.   History   Social History  . Marital Status: Single    Spouse Name: Cristal DeerChristopher    Number of Children: 3  . Years of Education: 12+   Occupational History  . CUSTOMER SERVICE    Social History  Main Topics  . Smoking status: Former Smoker -- 26 years    Types: Cigarettes    Quit date: 04/03/2008  . Smokeless tobacco: Never Used  . Alcohol Use: No  . Drug Use: No  . Sexual Activity: Yes    Partners: Male    Birth Control/ Protection: Surgical   Social History Narrative   Engaged to be married.  Lives with him currently.  Two sons are grown and live independently.  The youngest is 29 and lives with her.   Current Outpatient Prescriptions on File Prior to Visit  Medication Sig Dispense Refill  . albuterol (PROVENTIL HFA;VENTOLIN HFA) 108 (90 BASE) MCG/ACT  inhaler Inhale 2 puffs into the lungs every 6 (six) hours as needed for wheezing or shortness of breath (cough, shortness of breath or wheezing.).  1 Inhaler  1  . glipiZIDE (GLUCOTROL) 5 MG tablet Take 1 tablet (5 mg total) by mouth 2 (two) times daily.  60 tablet  3  . sitaGLIPtin (JANUVIA) 50 MG tablet Take 1 tablet (50 mg total) by mouth once.  30 tablet  3   No current facility-administered medications on file prior to visit.   Allergies  Allergen Reactions  . Metformin And Related Nausea Only  . Adhesive [Tape] Rash   Family History  Problem Relation Age of Onset  . Cancer Mother     Cervical s/p hysterectomy; mets to the abdomen  . Hypertension Mother   . Cancer Father     prostate cancer   PE: BP 114/64  Pulse 89  Temp(Src) 98.2 F (36.8 C) (Oral)  Resp 12  Ht 5' 4.5" (1.638 m)  Wt 190 lb (86.183 kg)  BMI 32.12 kg/m2  SpO2 96% Wt Readings from Last 3 Encounters:  04/29/14 190 lb (86.183 kg)  04/13/14 187 lb (84.823 kg)  04/12/14 187 lb (84.823 kg)   Constitutional: overweight, in NAD Eyes: PERRLA, EOMI, no exophthalmos ENT: moist mucous membranes, no thyromegaly, no cervical lymphadenopathy Cardiovascular: RRR, No MRG Respiratory: CTA B Gastrointestinal: abdomen soft, NT, ND, BS+ Musculoskeletal: no deformities, strength intact in all 4 Skin: moist, warm, no rashes Neurological: no tremor with outstretched hands, DTR normal in all 4  ASSESSMENT: 1. DM2, non-insulin-dependent, uncontrolled, without complications  PLAN:  1. Patient with long-standing, recently more uncontrolled diabetes, on oral antidiabetic regimen, which is insufficient - We discussed about options for treatment, and I suggested to add metformin - XR:  Patient Instructions  Please start Metformin 500 mg with dinner x 4 days. If you tolerate this well, add another Metformin tablet (500 mg) with breakfast x 4 days. If you tolerate this well, add another metformin tablet with dinner (total 1000  mg) x 4 days. If you tolerate this well, add another metformin tablet with breakfast (total 1000 mg). Continue with 1000 mg of metformin twice a day with breakfast and dinner. Continue Januvia 100 mg in am. Continue Glipizide 5 mg 2x a day before breakfast and dinner. Please return in 1 month with your sugar log.  - I am not sure if we can do without insulin, but we will try. The fact that the HbA1c decreased from >14% to an ~10% based on the current sugars and the fact that, just 1 year ago, her HbA1c was 6%, makes me think that we can maybe avoid insulin. - continue checking sugars at different times of the day - check 3 times a day, rotating checks - given sugar log and advised how to fill it and  to bring it at next appt  - given foot care handout and explained the principles  - given instructions for hypoglycemia management "15-15 rule"  - advised for yearly eye exams >> she is up to date - Return to clinic in 1 mo with sugar log

## 2014-05-06 ENCOUNTER — Other Ambulatory Visit: Payer: Self-pay | Admitting: *Deleted

## 2014-05-06 DIAGNOSIS — IMO0001 Reserved for inherently not codable concepts without codable children: Secondary | ICD-10-CM

## 2014-05-06 DIAGNOSIS — E1165 Type 2 diabetes mellitus with hyperglycemia: Principal | ICD-10-CM

## 2014-05-06 MED ORDER — GLIPIZIDE 10 MG PO TABS: ORAL_TABLET | ORAL | Status: DC

## 2014-05-06 MED ORDER — SITAGLIPTIN PHOSPHATE 100 MG PO TABS: ORAL_TABLET | ORAL | Status: DC

## 2014-05-26 ENCOUNTER — Ambulatory Visit (INDEPENDENT_AMBULATORY_CARE_PROVIDER_SITE_OTHER): Payer: BC Managed Care – PPO | Admitting: Family Medicine

## 2014-05-26 ENCOUNTER — Encounter: Payer: Self-pay | Admitting: Family Medicine

## 2014-05-26 ENCOUNTER — Ambulatory Visit (INDEPENDENT_AMBULATORY_CARE_PROVIDER_SITE_OTHER): Payer: BC Managed Care – PPO

## 2014-05-26 VITALS — BP 100/62 | HR 87 | Temp 98.4°F | Resp 16 | Ht 64.0 in | Wt 198.0 lb

## 2014-05-26 DIAGNOSIS — M545 Low back pain, unspecified: Secondary | ICD-10-CM

## 2014-05-26 DIAGNOSIS — T148XXA Other injury of unspecified body region, initial encounter: Secondary | ICD-10-CM

## 2014-05-26 LAB — POCT URINALYSIS DIPSTICK
Bilirubin, UA: NEGATIVE
Blood, UA: NEGATIVE
Glucose, UA: NEGATIVE
Ketones, UA: NEGATIVE
Leukocytes, UA: NEGATIVE
Nitrite, UA: NEGATIVE
Protein, UA: NEGATIVE
Spec Grav, UA: 1.025
Urobilinogen, UA: 0.2
pH, UA: 5.5

## 2014-05-26 LAB — POCT UA - MICROSCOPIC ONLY
Casts, Ur, LPF, POC: NEGATIVE
Crystals, Ur, HPF, POC: NEGATIVE
Yeast, UA: NEGATIVE

## 2014-05-26 MED ORDER — CYCLOBENZAPRINE HCL 5 MG PO TABS: 5.0000 mg | ORAL_TABLET | Freq: Three times a day (TID) | ORAL | Status: DC | PRN

## 2014-05-26 MED ORDER — OXYCODONE-ACETAMINOPHEN 5-325 MG PO TABS: 1.0000 | ORAL_TABLET | Freq: Three times a day (TID) | ORAL | Status: DC | PRN

## 2014-05-26 NOTE — Progress Notes (Signed)
Chief Complaint:  Chief Complaint  Patient presents with  . constant pain across lower back    3 days    HPI: Kelly Simon is a 48 y.o. female who is here for 3 day history of low back pain on right now radiation to left , no n/w/t She denesie any injury , no prior back injuries. She can't sit , can't lay her back, can;t lay onher side. She just walks around. She has tried icey hot, heating pad, she has tried Aleve She has a throbbing, constant, 10/10 pain othertimes 5-6/10. When she is sitting it is 10/10, standing still 7-8/10. No incontinence  Past Medical History  Diagnosis Date  . Diabetes mellitus without complication   . Depression    Past Surgical History  Procedure Laterality Date  . Abdominal hysterectomy      ADENOMYOSIS; ovaries resected.   History   Social History  . Marital Status: Single    Spouse Name: [name]    Number of Children: 3  . Years of Education: 12+   Occupational History  . CUSTOMER SERVICE    Social History Main Topics  . Smoking status: Former Smoker -- 26 years    Types: Cigarettes    Quit date: 04/03/2008  . Smokeless tobacco: Never Used  . Alcohol Use: No  . Drug Use: No  . Sexual Activity: Yes    Partners: Male    Birth Control/ Protection: Surgical   Other Topics Concern  . None   Social History Narrative   Engaged to be married.  Lives with him currently.  Two sons are grown and live independently.  The youngest is 41 and lives with her.   Family History  Problem Relation Age of Onset  . Cancer Mother     Cervical s/p hysterectomy; mets to the abdomen  . Hypertension Mother   . Cancer Father     prostate cancer   Allergies  Allergen Reactions  . Metformin And Related Nausea Only  . Adhesive [Tape] Rash   Prior to Admission medications   Medication Sig Start Date End Date Taking? Authorizing Provider  albuterol (PROVENTIL HFA;VENTOLIN HFA) 108 (90 BASE) MCG/ACT inhaler Inhale 2 puffs into the  lungs every 6 (six) hours as needed for wheezing or shortness of breath (cough, shortness of breath or wheezing.). 04/13/14  Yes Peyton Najjar, MD  glipiZIDE (GLUCOTROL) 10 MG tablet Take 1 tablet 2 times daily before a meal; breakfast and dinner as directed. 05/06/14  Yes Carlus Pavlov, MD  metFORMIN (GLUCOPHAGE-XR) 500 MG 24 hr tablet Take 2 tablets (1,000 mg total) by mouth 2 (two) times daily with a meal. 04/29/14  Yes Carlus Pavlov, MD  sitaGLIPtin (JANUVIA) 100 MG tablet Take 1 table daily in the AM. 05/06/14  Yes Carlus Pavlov, MD     ROS: The patient denies fevers, chills, night sweats, unintentional weight loss, chest pain, palpitations, wheezing, dyspnea on exertion, nausea, vomiting, abdominal pain, dysuria, hematuria, melena, numbness, weakness, or tingling.   All other systems have been reviewed and were otherwise negative with the exception of those mentioned in the HPI and as above.    PHYSICAL EXAM: Filed Vitals:   05/26/14 1420  BP: 100/62  Pulse: 87  Temp: 98.4 F (36.9 C)  Resp: 16   Filed Vitals:   05/26/14 1420  Height:  (1.626 m)  Weight: 198 lb (89.812 kg)   Body mass index is 33.97 kg/(m^2).  General: Alert, no  acute distress HEENT:  Normocephalic, atraumatic, oropharynx patent. EOMI, PERRLA Cardiovascular:  Regular rate and rhythm, no rubs murmurs or gallops.  No Carotid bruits, radial pulse intact. No pedal edema.  Respiratory: Clear to auscultation bilaterally.  No wheezes, rales, or rhonchi.  No cyanosis, no use of accessory musculature GI: No organomegaly, abdomen is soft and non-tender, positive bowel sounds.  No masses. Skin: No rashes. Neurologic: Facial musculature symmetric. Psychiatric: Patient is appropriate throughout our interaction. Lymphatic: No cervical lymphadenopathy Musculoskeletal: Gait intact. + paramsk tenderness  Decrease ROM 5/5 strength, 2/2 DTRs No saddle anesthesia Straight leg negative unable to obtain Hip and  knee exam--normal   LABS: Results for orders placed in visit on 05/26/14  POCT UA - MICROSCOPIC ONLY      Result Value Ref Range   WBC, Ur, HPF, POC 0-1     RBC, urine, microscopic 0-3     Bacteria, U Microscopic trace     Mucus, UA trace     Epithelial cells, urine per micros 3-8     Crystals, Ur, HPF, POC neg     Casts, Ur, LPF, POC neg     Yeast, UA neg    POCT URINALYSIS DIPSTICK      Result Value Ref Range   Color, UA yellow     Clarity, UA clear     Glucose, UA neg     Bilirubin, UA neg     Ketones, UA neg     Spec Grav, UA 1.025     Blood, UA neg     pH, UA 5.5     Protein, UA neg     Urobilinogen, UA 0.2     Nitrite, UA neg     Leukocytes, UA Negative       EKG/XRAY:   Primary read interpreted by Dr. Conley Rolls at [facility].   ASSESSMENT/PLAN: Encounter Diagnoses  Name Primary?  . Left-sided low back pain without sciatica Yes  . Sprain and strain    Has a h/o 2 ulcers so cannot take NSAIDs regularly She can c/w Aleve if she likes with caution since she was taking this before Rx Flexeril, Rx Roxicet ROM exercise F/u prn  Gross sideeffects, risk and benefits, and alternatives of medications d/w patient. Patient is aware that all medications have potential sideeffects and we are unable to predict every sideeffect or drug-drug interaction that may occur.  Hamilton Capri PHUONG, DO 05/26/2014 3:25 PM

## 2014-05-28 ENCOUNTER — Encounter (HOSPITAL_COMMUNITY): Payer: Self-pay | Admitting: Emergency Medicine

## 2014-05-28 ENCOUNTER — Emergency Department (HOSPITAL_COMMUNITY)
Admission: EM | Admit: 2014-05-28 | Discharge: 2014-05-28 | Disposition: A | Discharge status: Home / Self Care | Payer: BC Managed Care – PPO | Attending: Emergency Medicine | Admitting: Emergency Medicine

## 2014-05-28 DIAGNOSIS — M5416 Radiculopathy, lumbar region: Secondary | ICD-10-CM

## 2014-05-28 DIAGNOSIS — R209 Unspecified disturbances of skin sensation: Secondary | ICD-10-CM | POA: Insufficient documentation

## 2014-05-28 DIAGNOSIS — Z8659 Personal history of other mental and behavioral disorders: Secondary | ICD-10-CM | POA: Insufficient documentation

## 2014-05-28 DIAGNOSIS — Z79899 Other long term (current) drug therapy: Secondary | ICD-10-CM | POA: Insufficient documentation

## 2014-05-28 DIAGNOSIS — IMO0002 Reserved for concepts with insufficient information to code with codable children: Secondary | ICD-10-CM | POA: Diagnosis not present

## 2014-05-28 DIAGNOSIS — M549 Dorsalgia, unspecified: Secondary | ICD-10-CM | POA: Insufficient documentation

## 2014-05-28 DIAGNOSIS — Z87891 Personal history of nicotine dependence: Secondary | ICD-10-CM | POA: Diagnosis not present

## 2014-05-28 DIAGNOSIS — E119 Type 2 diabetes mellitus without complications: Secondary | ICD-10-CM | POA: Diagnosis not present

## 2014-05-28 LAB — URINALYSIS, ROUTINE W REFLEX MICROSCOPIC
Bilirubin Urine: NEGATIVE
Glucose, UA: NEGATIVE mg/dL
HGB URINE DIPSTICK: NEGATIVE
Ketones, ur: NEGATIVE mg/dL
Leukocytes, UA: NEGATIVE
Nitrite: NEGATIVE
PH: 5.5 (ref 5.0–8.0)
Protein, ur: NEGATIVE mg/dL
SPECIFIC GRAVITY, URINE: 1.02 (ref 1.005–1.030)
UROBILINOGEN UA: 0.2 mg/dL (ref 0.0–1.0)

## 2014-05-28 MED ORDER — METHYLPREDNISOLONE 4 MG PO KIT: PACK | ORAL | Status: DC

## 2014-05-28 MED ORDER — HYDROMORPHONE HCL PF 1 MG/ML IJ SOLN
1.0000 mg | Freq: Once | INTRAMUSCULAR | Status: AC
  Administered 2014-05-28: 1 mg via INTRAMUSCULAR
  Filled 2014-05-28: qty 1

## 2014-05-28 NOTE — ED Provider Notes (Signed)
CSN: [id number]     Arrival date & time 05/28/14  1145 History   First MD Initiated Contact with Patient 05/28/14 1305     Chief Complaint  Patient presents with  . Back Pain  . Numbness    HPI Patient presents to the emergency room with complaints of back pain associated with numbness of her thigh. Patient states she noticed the other morning that she had some pain in her lower back. At that time she was not having any issues with weakness or numbness. She did not recall any specific injury. She went and saw her primary doctor who told her that she probably had a muscle spasm. She was given a prescription for pain medications and muscle relaxants. This morning she woke up noticing that she had numbness in her left thigh it does not involve her lower leg she has not had any trouble with weakness. He has not been issues with incontinence. No abdominal pain or dysuria. Past Medical History  Diagnosis Date  . Diabetes mellitus without complication   . Depression    Past Surgical History  Procedure Laterality Date  . Abdominal hysterectomy      ADENOMYOSIS; ovaries resected.   Family History  Problem Relation Age of Onset  . Cancer Mother     Cervical s/p hysterectomy; mets to the abdomen  . Hypertension Mother   . Cancer Father     prostate cancer   History  Substance Use Topics  . Smoking status: Former Smoker -- 26 years    Types: Cigarettes    Quit date: 04/03/2008  . Smokeless tobacco: Never Used  . Alcohol Use: No   OB History   Grav Para Term Preterm Abortions TAB SAB Ect Mult Living                 Review of Systems  Constitutional: Negative for fever.  All other systems reviewed and are negative.     Allergies  Metformin and related and Adhesive  Home Medications   Prior to Admission medications   Medication Sig Start Date End Date Taking? Authorizing Provider  albuterol (PROVENTIL HFA;VENTOLIN HFA) 108 (90 BASE) MCG/ACT inhaler Inhale 1-2 puffs into the  lungs every 6 (six) hours as needed for wheezing or shortness of breath.   Yes Historical Provider, MD  cyclobenzaprine (FLEXERIL) 5 MG tablet Take 5 mg by mouth 3 (three) times daily as needed for muscle spasms.   Yes Historical Provider, MD  glipiZIDE (GLUCOTROL) 10 MG tablet Take 10 mg by mouth 2 (two) times daily before a meal.   Yes Historical Provider, MD  metFORMIN (GLUCOPHAGE-XR) 500 MG 24 hr tablet Take 1,000 mg by mouth 2 (two) times daily.   Yes Historical Provider, MD  naproxen sodium (ANAPROX) 220 MG tablet Take 440 mg by mouth 2 (two) times daily as needed (for pain).   Yes Historical Provider, MD  oxyCODONE-acetaminophen (PERCOCET/ROXICET) 5-325 MG per tablet Take 1 tablet by mouth every 8 (eight) hours as needed for severe pain.   Yes Historical Provider, MD  sitaGLIPtin (JANUVIA) 100 MG tablet Take 100 mg by mouth daily.   Yes Historical Provider, MD  methylPREDNISolone (MEDROL DOSEPAK) 4 MG tablet Take as directed on package 05/28/14   Linwood Dibbles, MD   BP 117/60  Pulse 86  Temp(Src) 98.5 F (36.9 C) (Oral)  Resp 20  Ht  (1.626 m)  Wt 198 lb (89.812 kg)  BMI 33.97 kg/m2  SpO2 100% Physical Exam  Nursing note  and vitals reviewed. Constitutional: She appears well-developed and well-nourished.  HENT:  Head: Normocephalic and atraumatic.  Right Ear: External ear normal.  Left Ear: External ear normal.  Nose: Nose normal.  Eyes: Conjunctivae and EOM are normal.  Neck: Neck supple. No tracheal deviation present.  Pulmonary/Chest: Effort normal. No stridor. No respiratory distress.  Abdominal: Soft. Bowel sounds are normal. She exhibits no distension. There is no tenderness. There is no rebound and no guarding.  Musculoskeletal: She exhibits no edema and no tenderness.       Lumbar back: She exhibits decreased range of motion, pain and spasm. She exhibits no swelling and no edema.  Neurological: She is alert. She is not disoriented. A sensory deficit is present. No  cranial nerve deficit. She exhibits normal muscle tone. Coordination normal.  Reflex Scores:      Patellar reflexes are 2+ on the right side and 2+ on the left side.      Achilles reflexes are 2+ on the right side and 2+ on the left side. Subjective decreased sensation to light touch her left thigh above the knee  Skin: Skin is warm and dry. No rash noted. She is not diaphoretic. No erythema.  Psychiatric: She has a normal mood and affect. Her behavior is normal. Thought content normal.    ED Course  Procedures (including critical care time) Labs Review Labs Reviewed  URINALYSIS, ROUTINE W REFLEX MICROSCOPIC    Imaging Review Dg Lumbar Spine Complete  05/26/2014   CLINICAL DATA:  Low back pain, left-sided  EXAM: LUMBAR SPINE - COMPLETE 4+ VIEW  COMPARISON:  None.  FINDINGS: The lumbar vertebral bodies are preserved in height. The twelfth ribs are hypoplastic/ transitional. The intervertebral disc space heights are reasonably well maintained. No significant facet joint hypertrophy is demonstrated. There is a right-sided lateral endplate spur from the superior margin of the body of L4. There is no spondylolisthesis. The observed portions of the sacrum are normal.  IMPRESSION: There is no acute bony abnormality of the lumbar spine nor evidence of significant degenerative change.   Electronically Signed   By: David  [place]   On: 05/26/2014 15:33     EKG Interpretation None      MDM   Final diagnoses:  Lumbar radiculopathy, acute    Patient's symptoms are suggestive of a lumbar radiculopathy. She may have a herniated disc involving L2-L3.  Discussed the findings with the patient. I recommended followup with a primary doctor or a spine doctor. Discussed warning signs and precautions.  I will try her on a course of oral steroids.    Linwood Dibbles, MD 05/28/14 [phone]

## 2014-05-28 NOTE — ED Notes (Signed)
MD Knapp at the bedside  

## 2014-05-28 NOTE — Discharge Instructions (Signed)
Sciatica Sciatica is pain, weakness, numbness, or tingling along the path of the sciatic nerve. The nerve starts in the lower back and runs down the back of each leg. The nerve controls the muscles in the lower leg and in the back of the knee, while also providing sensation to the back of the thigh, lower leg, and the sole of your foot. Sciatica is a symptom of another medical condition. For instance, nerve damage or certain conditions, such as a herniated disk or bone spur on the spine, pinch or put pressure on the sciatic nerve. This causes the pain, weakness, or other sensations normally associated with sciatica. Generally, sciatica only affects one side of the body. CAUSES   Herniated or slipped disc.  Degenerative disk disease.  A pain disorder involving the narrow muscle in the buttocks (piriformis syndrome).  Pelvic injury or fracture.  Pregnancy.  Tumor (rare). SYMPTOMS  Symptoms can vary from mild to very severe. The symptoms usually travel from the low back to the buttocks and down the back of the leg. Symptoms can include:  Mild tingling or dull aches in the lower back, leg, or hip.  Numbness in the back of the calf or sole of the foot.  Burning sensations in the lower back, leg, or hip.  Sharp pains in the lower back, leg, or hip.  Leg weakness.  Severe back pain inhibiting movement. These symptoms may get worse with coughing, sneezing, laughing, or prolonged sitting or standing. Also, being overweight may worsen symptoms. DIAGNOSIS  Your caregiver will perform a physical exam to look for common symptoms of sciatica. He or she may ask you to do certain movements or activities that would trigger sciatic nerve pain. Other tests may be performed to find the cause of the sciatica. These may include:  Blood tests.  X-rays.  Imaging tests, such as an MRI or CT scan. TREATMENT  Treatment is directed at the cause of the sciatic pain. Sometimes, treatment is not necessary  and the pain and discomfort goes away on its own. If treatment is needed, your caregiver may suggest:  Over-the-counter medicines to relieve pain.  Prescription medicines, such as anti-inflammatory medicine, muscle relaxants, or narcotics.  Applying heat or ice to the painful area.  Steroid injections to lessen pain, irritation, and inflammation around the nerve.  Reducing activity during periods of pain.  Exercising and stretching to strengthen your abdomen and improve flexibility of your spine. Your caregiver may suggest losing weight if the extra weight makes the back pain worse.  Physical therapy.  Surgery to eliminate what is pressing or pinching the nerve, such as a bone spur or part of a herniated disk. HOME CARE INSTRUCTIONS   Only take over-the-counter or prescription medicines for pain or discomfort as directed by your caregiver.  Apply ice to the affected area for 20 minutes, 3-4 times a day for the first 48-72 hours. Then try heat in the same way.  Exercise, stretch, or perform your usual activities if these do not aggravate your pain.  Attend physical therapy sessions as directed by your caregiver.  Keep all follow-up appointments as directed by your caregiver.  Do not wear high heels or shoes that do not provide proper support.  Check your mattress to see if it is too soft. A firm mattress may lessen your pain and discomfort. SEEK IMMEDIATE MEDICAL CARE IF:   You lose control of your bowel or bladder (incontinence).  You have increasing weakness in the lower back, pelvis, buttocks,   or legs.  You have redness or swelling of your back.  You have a burning sensation when you urinate.  You have pain that gets worse when you lie down or awakens you at night.  Your pain is worse than you have experienced in the past.  Your pain is lasting longer than 4 weeks.  You are suddenly losing weight without reason. MAKE SURE YOU:  Understand these  instructions.  Will watch your condition.  Will get help right away if you are not doing well or get worse. Document Released: 09/14/2001 Document Revised: 03/21/2012 Document Reviewed: 01/30/2012 [employer] Patient Information 2015 [employer]. This information is not intended to replace advice given to you by your health care provider. Make sure you discuss any questions you have with your health care provider.  

## 2014-05-28 NOTE — ED Notes (Signed)
Patient c/ol numbness to left side states she was seen by her MD yest and was given muscle relaxant .

## 2014-05-28 NOTE — ED Notes (Signed)
Pt states that she woke this morning with left leg numbness. Pt states that she has had back pain that she was seen for yesterday. Pt states that she was given muscle relaxers. Pt states that she now has pain when she moves left leg. No neuro deficits at triage.

## 2014-05-30 ENCOUNTER — Ambulatory Visit (INDEPENDENT_AMBULATORY_CARE_PROVIDER_SITE_OTHER): Payer: BC Managed Care – PPO | Admitting: Family Medicine

## 2014-05-30 VITALS — BP 124/78 | HR 100 | Temp 98.2°F | Resp 18 | Ht 64.0 in | Wt 195.4 lb

## 2014-05-30 DIAGNOSIS — E119 Type 2 diabetes mellitus without complications: Secondary | ICD-10-CM

## 2014-05-30 DIAGNOSIS — D72829 Elevated white blood cell count, unspecified: Secondary | ICD-10-CM

## 2014-05-30 DIAGNOSIS — E162 Hypoglycemia, unspecified: Secondary | ICD-10-CM

## 2014-05-30 DIAGNOSIS — R61 Generalized hyperhidrosis: Secondary | ICD-10-CM

## 2014-05-30 DIAGNOSIS — M545 Low back pain, unspecified: Secondary | ICD-10-CM

## 2014-05-30 LAB — POCT CBC
Granulocyte percent: 60.1 %G (ref 37–80)
HEMATOCRIT: 44.9 % (ref 37.7–47.9)
Hemoglobin: 13.7 g/dL (ref 12.2–16.2)
LYMPH, POC: 4.1 — AB (ref 0.6–3.4)
MCH: 26.8 pg — AB (ref 27–31.2)
MCHC: 30.5 g/dL — AB (ref 31.8–35.4)
MCV: 87.8 fL (ref 80–97)
MID (cbc): 0.6 (ref 0–0.9)
MPV: 7.7 fL (ref 0–99.8)
POC Granulocyte: 7.1 — AB (ref 2–6.9)
POC LYMPH %: 34.6 % (ref 10–50)
POC MID %: 5.3 %M (ref 0–12)
Platelet Count, POC: 304 10*3/uL (ref 142–424)
RBC: 5.12 M/uL (ref 4.04–5.48)
RDW, POC: 17.5 %
WBC: 11.8 10*3/uL — AB (ref 4.6–10.2)

## 2014-05-30 LAB — GLUCOSE, POCT (MANUAL RESULT ENTRY)
POC GLUCOSE: 125 mg/dL — AB (ref 70–99)
POC Glucose: 58 mg/dl — AB (ref 70–99)

## 2014-05-30 NOTE — Patient Instructions (Addendum)
Stop glipizide for now due to your low blood sugars.  Check your blood sugars 3 times per day until this stabilizes. See other information below. If your blood sugars go over 200 - we may need to start back at 1/2 of previous dose of glipizide, but call here or Dr. Charlean Sanfilippo office to determine what dose you should be taking at that time.  Carry glucose (sugar) with you to take if you do have low blood sugars again.   Ok to continue steroid for now and muscle relaxer as previously prescribed. You can take 1/2 to 1 of the oxycodone if needed for the back pain. We will try to get the MRI scheduled tomorrow. Return to the clinic or go to the nearest emergency room if any of your symptoms worsen or new symptoms occur.    Hypoglycemia Hypoglycemia occurs when the glucose in your blood is too low. Glucose is a type of sugar that is your body's main energy source. Hormones, such as insulin and glucagon, control the level of glucose in the blood. Insulin lowers blood glucose and glucagon increases blood glucose. Having too much insulin in your blood stream, or not eating enough food containing sugar, can result in hypoglycemia. Hypoglycemia can happen to people with or without diabetes. It can develop quickly and can be a medical emergency.  CAUSES   Missing or delaying meals.  Not eating enough carbohydrates at meals.  Taking too much diabetes medicine.  Not timing your oral diabetes medicine or insulin doses with meals, snacks, and exercise.  Nausea and vomiting.  Certain medicines.  Severe illnesses, such as hepatitis, kidney disorders, and certain eating disorders.  Increased activity or exercise without eating something extra or adjusting medicines.  Drinking too much alcohol.  A nerve disorder that affects body functions like your heart rate, blood pressure, and digestion (autonomic neuropathy).  A condition where the stomach muscles do not function properly (gastroparesis). Therefore,  medicines and food may not absorb properly.  Rarely, a tumor of the pancreas can produce too much insulin. SYMPTOMS   Hunger.  Sweating (diaphoresis).  Change in body temperature.  Shakiness.  Headache.  Anxiety.  Lightheadedness.  Irritability.  Difficulty concentrating.  Dry mouth.  Tingling or numbness in the hands or feet.  Restless sleep or sleep disturbances.  Altered speech and coordination.  Change in mental status.  Seizures or prolonged convulsions.  Combativeness.  Drowsiness (lethargic).  Weakness.  Increased heart rate or palpitations.  Confusion.  Pale, gray skin color.  Blurred or double vision.  Fainting. DIAGNOSIS  A physical exam and medical history will be performed. Your caregiver may make a diagnosis based on your symptoms. Blood tests and other lab tests may be performed to confirm a diagnosis. Once the diagnosis is made, your caregiver will see if your signs and symptoms go away once your blood glucose is raised.  TREATMENT  Usually, you can easily treat your hypoglycemia when you notice symptoms.  Check your blood glucose. If it is less than 70 mg/dl, take one of the following:   3-4 glucose tablets.    cup juice.    cup regular soda.   1 cup skim milk.   -1 tube of glucose gel.   5-6 hard candies.   Avoid high-fat drinks or food that may delay a rise in blood glucose levels.  Do not take more than the recommended amount of sugary foods, drinks, gel, or tablets. Doing so will cause your blood glucose to go too  high.   Wait 10-15 minutes and recheck your blood glucose. If it is still less than 70 mg/dl or below your target range, repeat treatment.   Eat a snack if it is more than 1 hour until your next meal.  There may be a time when your blood glucose may go so low that you are unable to treat yourself at home when you start to notice symptoms. You may need someone to help you. You may even faint or be  unable to swallow. If you cannot treat yourself, someone will need to bring you to the hospital.  HOME CARE INSTRUCTIONS  If you have diabetes, follow your diabetes management plan by:  Taking your medicines as directed.  Following your exercise plan.  Following your meal plan. Do not skip meals. Eat on time.  Testing your blood glucose regularly. Check your blood glucose before and after exercise. If you exercise longer or different than usual, be sure to check blood glucose more frequently.  Wearing your medical alert jewelry that says you have diabetes.  Identify the cause of your hypoglycemia. Then, develop ways to prevent the recurrence of hypoglycemia.  Do not take a hot bath or shower right after an insulin shot.  Always carry treatment with you. Glucose tablets are the easiest to carry.  If you are going to drink alcohol, drink it only with meals.  Tell friends or family members ways to keep you safe during a seizure. This may include removing hard or sharp objects from the area or turning you on your side.  Maintain a healthy weight. SEEK MEDICAL CARE IF:   You are having problems keeping your blood glucose in your target range.  You are having frequent episodes of hypoglycemia.  You feel you might be having side effects from your medicines.  You are not sure why your blood glucose is dropping so low.  You notice a change in vision or a new problem with your vision. SEEK IMMEDIATE MEDICAL CARE IF:   Confusion develops.  A change in mental status occurs.  The inability to swallow develops.  Fainting occurs. Document Released: 09/20/2005 Document Revised: 09/25/2013 Document Reviewed: 01/17/2012 [facility] Patient Information 2015 [place], [place]. This information is not intended to replace advice given to you by your health care provider. Make sure you discuss any questions you have with your health care provider.   Back Pain, Adult Low back pain is very  common. About 1 in 5 people have back pain.The cause of low back pain is rarely dangerous. The pain often gets better over time.About half of people with a sudden onset of back pain feel better in just 2 weeks. About 8 in 10 people feel better by 6 weeks.  CAUSES Some common causes of back pain include:  Strain of the muscles or ligaments supporting the spine.  Wear and tear (degeneration) of the spinal discs.  Arthritis.  Direct injury to the back. DIAGNOSIS Most of the time, the direct cause of low back pain is not known.However, back pain can be treated effectively even when the exact cause of the pain is unknown.Answering your caregiver's questions about your overall health and symptoms is one of the most accurate ways to make sure the cause of your pain is not dangerous. If your caregiver needs more information, he or she may order lab work or imaging tests (X-rays or MRIs).However, even if imaging tests show changes in your back, this usually does not require surgery. HOME CARE INSTRUCTIONS For  many people, back pain returns.Since low back pain is rarely dangerous, it is often a condition that people can learn to [facility] their own.   Remain active. It is stressful on the back to sit or stand in one place. Do not sit, drive, or stand in one place for more than 30 minutes at a time. Take short walks on level surfaces as soon as pain allows.Try to increase the length of time you walk each day.  Do not stay in bed.Resting more than 1 or 2 days can delay your recovery.  Do not avoid exercise or work.Your body is made to move.It is not dangerous to be active, even though your back may hurt.Your back will likely heal faster if you return to being active before your pain is gone.  Pay attention to your body when you bend and lift. Many people have less discomfortwhen lifting if they bend their knees, keep the load close to their bodies,and avoid twisting. Often, the most  comfortable positions are those that put less stress on your recovering back.  Find a comfortable position to sleep. Use a firm mattress and lie on your side with your knees slightly bent. If you lie on your back, put a pillow under your knees.  Only take over-the-counter or prescription medicines as directed by your caregiver. Over-the-counter medicines to reduce pain and inflammation are often the most helpful.Your caregiver may prescribe muscle relaxant drugs.These medicines help dull your pain so you can more quickly return to your normal activities and healthy exercise.  Put ice on the injured area.  Put ice in a plastic bag.  Place a towel between your skin and the bag.  Leave the ice on for 15-20 minutes, 03-04 times a day for the first 2 to 3 days. After that, ice and heat may be alternated to reduce pain and spasms.  Ask your caregiver about trying back exercises and gentle massage. This may be of some benefit.  Avoid feeling anxious or stressed.Stress increases muscle tension and can worsen back pain.It is important to recognize when you are anxious or stressed and learn ways to manage it.Exercise is a great option. SEEK MEDICAL CARE IF:  You have pain that is not relieved with rest or medicine.  You have pain that does not improve in 1 week.  You have new symptoms.  You are generally not feeling well. SEEK IMMEDIATE MEDICAL CARE IF:   You have pain that radiates from your back into your legs.  You develop new bowel or bladder control problems.  You have unusual weakness or numbness in your arms or legs.  You develop nausea or vomiting.  You develop abdominal pain.  You feel faint. Document Released: 09/20/2005 Document Revised: 03/21/2012 Document Reviewed: 01/22/2014 [facility] Patient Information 2015 [place], [place]. This information is not intended to replace advice given to you by your health care provider. Make sure you discuss any questions you have with  your health care provider.

## 2014-05-30 NOTE — Progress Notes (Addendum)
Subjective:  This chart was scribed for Kelly Staggers, MD by Kelly Simon, Medical Scribe. This patient was seen in Room 4 and the patient's care was started at 2:45 PM.   Patient ID: Kelly Simon, female    DOB: [birth year], 48 y.o.   MRN: [id number]  HPI HPI Comments: Kelly Simon is a 48 y.o. female who presents to the [facility] for a hospitalization follow-up.  She was last seen four days ago by Dr. Conley Rolls with a three day history of lower back pain radiating to the left sciatica.  History of PUD.  Prescribed Roxicet, Flexeril, and range of motion exercises.  Seen in the emergency room 2 days ago after waking up with left leg numbness.  Per MD note from emergency room, numbness across the left thigh but not into her lower leg.  No weakness.  No incontinence.  LS-spine x-ray without acute abnormalities.  Diagnosed with a possible lumbar radiculopathy.  Possibly L2-L3.  Was started on a Medrol dose pack.    History of DM Type II.  She takes Metformin, Glipizide, and Januvia with last A1c of greater than 14 on July 10.  Last seen by endocrinologist 1 month ago.  Had been off of medication until July 11.  Plan on follow-up in 1 month.  Appointment with Dr. Elvera Lennox on September 8.  Fasting blood sugars in am 91-102. Nighttime readings 84-86.  Her blood sugars have been running anywhere from 89-92 over the past couple of days.  Her blood sugar dropped to 34 this morning at 11:30 which she remedied by having a glass of orange juice. Felt shaky, lightheaded then.  After the orange juice her sugar was 74.  The highest her sugars have been the past couple of days have been 93-94.    She is still experiencing waxing and waning back pain and she is still experience numbness on the side of left leg and left thigh.  She did not injure herself prior to the onset of her symptoms.  She denies fever, chills, saddle anesthesia, nausea, vomiting, abdominal pain, urinary incontinence,  and rash as associated symptoms.  She will experience night sweats.  She feels urgency to get to the restroom when she has to defecate.  She had diarrhea on Monday and Tuesday.  She is taking  of Metformin four times a day - two in the morning and two at night.  She is still taking  Flexeril 3 times a day.  She took the Roxicet she was prescribed but stopped taking it because it did not relieve her symptoms.  She was referred to Dr. Mikal Plane at the ED however she cannot schedule an appointment with him until she has the proper documentation.  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    Patient Active Problem List   Diagnosis Date Noted  . Type II or unspecified type diabetes mellitus without mention of complication, uncontrolled 04/12/2014  . Anemia 05/02/2013  . BMI 29.0-29.9,adult 08/16/2012  . Depression with anxiety 07/12/2012   Past Medical History  Diagnosis Date  . Diabetes mellitus without complication   . Depression    Past Surgical History  Procedure Laterality Date  . Abdominal hysterectomy      ADENOMYOSIS; ovaries resected.   Allergies  Allergen Reactions  . Metformin And Related     diarrhea  . Adhesive [Tape] Rash   Prior to Admission medications   Medication Sig Start Date  End Date Taking? Authorizing Provider  albuterol (PROVENTIL HFA;VENTOLIN HFA) 108 (90 BASE) MCG/ACT inhaler Inhale 1-2 puffs into the lungs every 6 (six) hours as needed for wheezing or shortness of breath.   Yes Historical Provider, MD  cyclobenzaprine (FLEXERIL) 5 MG tablet Take 5 mg by mouth 3 (three) times daily as needed for muscle spasms.   Yes Historical Provider, MD  glipiZIDE (GLUCOTROL) 10 MG tablet Take 10 mg by mouth 2 (two) times daily before a meal.   Yes Historical Provider, MD  metFORMIN (GLUCOPHAGE-XR) 500 MG 24 hr tablet Take 1,000 mg by mouth 2 (two) times daily.   Yes Historical Provider, MD  methylPREDNISolone (MEDROL DOSEPAK) 4 MG tablet Take as directed on  package 05/28/14  Yes Linwood Dibbles, MD  oxyCODONE-acetaminophen (PERCOCET/ROXICET) 5-325 MG per tablet Take 1 tablet by mouth every 8 (eight) hours as needed for severe pain.   Yes Historical Provider, MD  sitaGLIPtin (JANUVIA) 100 MG tablet Take 100 mg by mouth daily.   Yes Historical Provider, MD   History   Social History  . Marital Status: Single    Spouse Name: Kelly Simon    Number of Children: 3  . Years of Education: 12+   Occupational History  . CUSTOMER SERVICE    Social History Main Topics  . Smoking status: Former Smoker -- 26 years    Types: Cigarettes    Quit date: 04/03/2008  . Smokeless tobacco: Never Used  . Alcohol Use: No  . Drug Use: No  . Sexual Activity: Yes    Partners: Male    Birth Control/ Protection: Surgical   Other Topics Concern  . Not on file   Social History Narrative   Engaged to be married.  Lives with him currently.  Two sons are grown and live independently.  The youngest is 42 and lives with her.     Review of Systems  Constitutional: Negative for fever and chills.  Skin: Negative for rash.     Objective:  Physical Exam  Vitals reviewed. Constitutional: She is oriented to person, place, and time. She appears well-developed and well-nourished.  HENT:  Head: Normocephalic and atraumatic.  Eyes: Conjunctivae and EOM are normal. Pupils are equal, round, and reactive to light.  Neck: Carotid bruit is not present.  Cardiovascular: Normal rate, regular rhythm, normal heart sounds and intact distal pulses.   Pulmonary/Chest: Effort normal and breath sounds normal.  Abdominal: Soft. She exhibits no pulsatile midline mass. There is no tenderness.  Musculoskeletal:  Difficulty with heel and toe walk.  Feels weakness when putting pressure on her left side.  Guarded range of motion.  Minimal flexion approximately 15-20 degrees.  Minimal extension.  Numbness across the dorsum of the left leg and lower abdomen.    Neurological: She is alert and  oriented to person, place, and time.  Reflexes were 2+ at her patella and achilles bilaterally.  Babinski negative bilaterally.    Skin: Skin is warm and dry.  No rash in the effected area.  Psychiatric: She has a normal mood and affect. Her behavior is normal.    Filed Vitals:   05/30/14 1405  BP: 124/78  Pulse: 100  Temp: 98.2 F (36.8 C)  TempSrc: Oral  Resp: 18  Height:  (1.626 m)  Weight: 195 lb 6.4 oz (88.633 kg)  SpO2: 98%   Results for orders placed in visit on 05/30/14  POCT CBC      Result Value Ref Range   WBC 11.8 (*)  4.6 - 10.2 K/uL   Lymph, poc 4.1 (*) 0.6 - 3.4   POC LYMPH PERCENT 34.6  10 - 50 %L   MID (cbc) 0.6  0 - 0.9   POC MID % 5.3  0 - 12 %M   POC Granulocyte 7.1 (*) 2 - 6.9   Granulocyte percent 60.1  37 - 80 %G   RBC 5.12  4.04 - 5.48 M/uL   Hemoglobin 13.7  12.2 - 16.2 g/dL   HCT, POC 16.1  09.6 - 47.9 %   MCV 87.8  80 - 97 fL   MCH, POC 26.8 (*) 27 - 31.2 pg   MCHC 30.5 (*) 31.8 - 35.4 g/dL   RDW, POC 04.5     Platelet Count, POC 304  142 - 424 K/uL   MPV 7.7  0 - 99.8 fL  GLUCOSE, POCT (MANUAL RESULT ENTRY)      Result Value Ref Range   POC Glucose 58 (*) 70 - 99 mg/dl   4:09 PM feels ok - not sweaty now -just earlier during the exam, not lightheaded or dizzy.   Last dose of glipizide at 8:00 am (took metformin and Januvia then as well).  Apple juice given  4:35 PM GLUCOSE, POCT (MANUAL RESULT ENTRY)      Result Value Ref Range   POC Glucose 125 (*) 70 - 99 mg/dl  Feels well - see plan below.    Assessment & Plan:  Alishea Beaudin is a 49 y.o. female Left low back pain, with sciatica presence unspecified - Plan: MR Lumbar Spine Wo Contrast, CANCELED: MR Lumbar Spine Wo Contrast Leukocytosis, unspecified - Plan: MR Lumbar Spine Wo Contrast, CANCELED: MR Lumbar Spine Wo Contrast  - possible HNP with possible upper lumbar neuropathy.  Afebrile, suspect the leukocytosis is from prednisone use, but will schedule MRI tomorrow to  R/O HNP and less likely discitis. Cont mm relaxant, oxycodone if needed - sed, and medrol dosepak for now. RtC/ER precautions.   Type 2 diabetes mellitus without complication, Hypoglycemia - Plan: POCT CBC, POCT glucose (manual entry), POCT glucose (manual entry), Night sweats - Plan: POCT CBC, POCT glucose (manual entry)  - improved in office with juice. Night sweats may be from hypoglycemia, and slight increase in am may be Somogyi effect, not dawn phenomenon.   -stop Glipizide for now, frequent glucose monitoring, and rtc/ER precautions as below.   -continue metformin and Januvia at current doses. If diarrhea persists - discussed may be due to metformin and dosing adjustment may be needed.    RTC precautions.    No orders of the defined types were placed in this encounter.   Patient Instructions  Stop glipizide for now due to your low blood sugars.  Check your blood sugars 3 times per day until this stabilizes. See other information below. If your blood sugars go over 200 - we may need to start back at 1/2 of previous dose of glipizide, but call here or Dr. Charlean Sanfilippo office to determine what dose you should be taking at that time.  Carry glucose (sugar) with you to take if you do have low blood sugars again.   Ok to continue steroid for now and muscle relaxer as previously prescribed. You can take 1/2 to 1 of the oxycodone if needed for the back pain. We will try to get the MRI scheduled tomorrow. Return to the clinic or go to the nearest emergency room if any of your symptoms worsen or new symptoms  occur.    Hypoglycemia Hypoglycemia occurs when the glucose in your blood is too low. Glucose is a type of sugar that is your body's main energy source. Hormones, such as insulin and glucagon, control the level of glucose in the blood. Insulin lowers blood glucose and glucagon increases blood glucose. Having too much insulin in your blood stream, or not eating enough food containing sugar, can  result in hypoglycemia. Hypoglycemia can happen to people with or without diabetes. It can develop quickly and can be a medical emergency.  CAUSES   Missing or delaying meals.  Not eating enough carbohydrates at meals.  Taking too much diabetes medicine.  Not timing your oral diabetes medicine or insulin doses with meals, snacks, and exercise.  Nausea and vomiting.  Certain medicines.  Severe illnesses, such as hepatitis, kidney disorders, and certain eating disorders.  Increased activity or exercise without eating something extra or adjusting medicines.  Drinking too much alcohol.  A nerve disorder that affects body functions like your heart rate, blood pressure, and digestion (autonomic neuropathy).  A condition where the stomach muscles do not function properly (gastroparesis). Therefore, medicines and food may not absorb properly.  Rarely, a tumor of the pancreas can produce too much insulin. SYMPTOMS   Hunger.  Sweating (diaphoresis).  Change in body temperature.  Shakiness.  Headache.  Anxiety.  Lightheadedness.  Irritability.  Difficulty concentrating.  Dry mouth.  Tingling or numbness in the hands or feet.  Restless sleep or sleep disturbances.  Altered speech and coordination.  Change in mental status.  Seizures or prolonged convulsions.  Combativeness.  Drowsiness (lethargic).  Weakness.  Increased heart rate or palpitations.  Confusion.  Pale, gray skin color.  Blurred or double vision.  Fainting. DIAGNOSIS  A physical exam and medical history will be performed. Your caregiver may make a diagnosis based on your symptoms. Blood tests and other lab tests may be performed to confirm a diagnosis. Once the diagnosis is made, your caregiver will see if your signs and symptoms go away once your blood glucose is raised.  TREATMENT  Usually, you can easily treat your hypoglycemia when you notice symptoms.  Check your blood glucose. If it  is less than 70 mg/dl, take one of the following:   3-4 glucose tablets.    cup juice.    cup regular soda.   1 cup skim milk.   -1 tube of glucose gel.   5-6 hard candies.   Avoid high-fat drinks or food that may delay a rise in blood glucose levels.  Do not take more than the recommended amount of sugary foods, drinks, gel, or tablets. Doing so will cause your blood glucose to go too high.   Wait 10-15 minutes and recheck your blood glucose. If it is still less than 70 mg/dl or below your target range, repeat treatment.   Eat a snack if it is more than 1 hour until your next meal.  There may be a time when your blood glucose may go so low that you are unable to treat yourself at home when you start to notice symptoms. You may need someone to help you. You may even faint or be unable to swallow. If you cannot treat yourself, someone will need to bring you to the hospital.  HOME CARE INSTRUCTIONS  If you have diabetes, follow your diabetes management plan by:  Taking your medicines as directed.  Following your exercise plan.  Following your meal plan. Do not skip meals. Eat on  time.  Testing your blood glucose regularly. Check your blood glucose before and after exercise. If you exercise longer or different than usual, be sure to check blood glucose more frequently.  Wearing your medical alert jewelry that says you have diabetes.  Identify the cause of your hypoglycemia. Then, develop ways to prevent the recurrence of hypoglycemia.  Do not take a hot bath or shower right after an insulin shot.  Always carry treatment with you. Glucose tablets are the easiest to carry.  If you are going to drink alcohol, drink it only with meals.  Tell friends or family members ways to keep you safe during a seizure. This may include removing hard or sharp objects from the area or turning you on your side.  Maintain a healthy weight. SEEK MEDICAL CARE IF:   You are having  problems keeping your blood glucose in your target range.  You are having frequent episodes of hypoglycemia.  You feel you might be having side effects from your medicines.  You are not sure why your blood glucose is dropping so low.  You notice a change in vision or a new problem with your vision. SEEK IMMEDIATE MEDICAL CARE IF:   Confusion develops.  A change in mental status occurs.  The inability to swallow develops.  Fainting occurs. Document Released: 09/20/2005 Document Revised: 09/25/2013 Document Reviewed: 01/17/2012 [facility] Patient Information 2015 [place], [place]. This information is not intended to replace advice given to you by your health care provider. Make sure you discuss any questions you have with your health care provider.   Back Pain, Adult Low back pain is very common. About 1 in 5 people have back pain.The cause of low back pain is rarely dangerous. The pain often gets better over time.About half of people with a sudden onset of back pain feel better in just 2 weeks. About 8 in 10 people feel better by 6 weeks.  CAUSES Some common causes of back pain include:  Strain of the muscles or ligaments supporting the spine.  Wear and tear (degeneration) of the spinal discs.  Arthritis.  Direct injury to the back. DIAGNOSIS Most of the time, the direct cause of low back pain is not known.However, back pain can be treated effectively even when the exact cause of the pain is unknown.Answering your caregiver's questions about your overall health and symptoms is one of the most accurate ways to make sure the cause of your pain is not dangerous. If your caregiver needs more information, he or she may order lab work or imaging tests (X-rays or MRIs).However, even if imaging tests show changes in your back, this usually does not require surgery. HOME CARE INSTRUCTIONS For many people, back pain returns.Since low back pain is rarely dangerous, it is often a condition  that people can learn to [facility] their own.   Remain active. It is stressful on the back to sit or stand in one place. Do not sit, drive, or stand in one place for more than 30 minutes at a time. Take short walks on level surfaces as soon as pain allows.Try to increase the length of time you walk each day.  Do not stay in bed.Resting more than 1 or 2 days can delay your recovery.  Do not avoid exercise or work.Your body is made to move.It is not dangerous to be active, even though your back may hurt.Your back will likely heal faster if you return to being active before your pain is gone.  Pay attention  to your body when you bend and lift. Many people have less discomfortwhen lifting if they bend their knees, keep the load close to their bodies,and avoid twisting. Often, the most comfortable positions are those that put less stress on your recovering back.  Find a comfortable position to sleep. Use a firm mattress and lie on your side with your knees slightly bent. If you lie on your back, put a pillow under your knees.  Only take over-the-counter or prescription medicines as directed by your caregiver. Over-the-counter medicines to reduce pain and inflammation are often the most helpful.Your caregiver may prescribe muscle relaxant drugs.These medicines help dull your pain so you can more quickly return to your normal activities and healthy exercise.  Put ice on the injured area.  Put ice in a plastic bag.  Place a towel between your skin and the bag.  Leave the ice on for 15-20 minutes, 03-04 times a day for the first 2 to 3 days. After that, ice and heat may be alternated to reduce pain and spasms.  Ask your caregiver about trying back exercises and gentle massage. This may be of some benefit.  Avoid feeling anxious or stressed.Stress increases muscle tension and can worsen back pain.It is important to recognize when you are anxious or stressed and learn ways to manage  it.Exercise is a great option. SEEK MEDICAL CARE IF:  You have pain that is not relieved with rest or medicine.  You have pain that does not improve in 1 week.  You have new symptoms.  You are generally not feeling well. SEEK IMMEDIATE MEDICAL CARE IF:   You have pain that radiates from your back into your legs.  You develop new bowel or bladder control problems.  You have unusual weakness or numbness in your arms or legs.  You develop nausea or vomiting.  You develop abdominal pain.  You feel faint. Document Released: 09/20/2005 Document Revised: 03/21/2012 Document Reviewed: 01/22/2014 [facility] Patient Information 2015 [place], [place]. This information is not intended to replace advice given to you by your health care provider. Make sure you discuss any questions you have with your health care provider.     I personally performed the services described in this documentation, which was scribed in my presence. The recorded information has been reviewed and considered, and addended by me as needed.

## 2014-05-31 ENCOUNTER — Telehealth: Payer: Self-pay

## 2014-05-31 NOTE — Telephone Encounter (Signed)
PA approved until 07/01/14 [id number]

## 2014-05-31 NOTE — Telephone Encounter (Signed)
Sent to [facility]

## 2014-05-31 NOTE — Telephone Encounter (Signed)
Please call [phone] to give more clinical information for this precert

## 2014-06-05 ENCOUNTER — Ambulatory Visit
Admission: RE | Admit: 2014-06-05 | Discharge: 2014-06-05 | Disposition: A | Discharge status: Home / Self Care | Payer: BC Managed Care – PPO | Source: Ambulatory Visit | Attending: Family Medicine | Admitting: Family Medicine

## 2014-06-05 DIAGNOSIS — M545 Low back pain: Secondary | ICD-10-CM

## 2014-06-05 DIAGNOSIS — D72829 Elevated white blood cell count, unspecified: Secondary | ICD-10-CM

## 2014-06-07 ENCOUNTER — Ambulatory Visit (INDEPENDENT_AMBULATORY_CARE_PROVIDER_SITE_OTHER): Payer: BC Managed Care – PPO | Admitting: Family Medicine

## 2014-06-07 VITALS — BP 124/78 | HR 92 | Temp 98.4°F | Resp 16 | Ht 63.75 in | Wt 197.0 lb

## 2014-06-07 DIAGNOSIS — M5442 Lumbago with sciatica, left side: Secondary | ICD-10-CM

## 2014-06-07 DIAGNOSIS — M5126 Other intervertebral disc displacement, lumbar region: Secondary | ICD-10-CM

## 2014-06-07 DIAGNOSIS — M5430 Sciatica, unspecified side: Secondary | ICD-10-CM

## 2014-06-07 MED ORDER — HYDROCODONE-ACETAMINOPHEN 5-325 MG PO TABS: 1.0000 | ORAL_TABLET | Freq: Four times a day (QID) | ORAL | Status: DC | PRN

## 2014-06-07 NOTE — Patient Instructions (Signed)
I will have our referrals dept ceck into the appointment with Dr. Jackelyn Knife office, and can send MRI report to them. There were 2 areas of disk protrusions or areas that may be pressing on your back nerves.  Continue flexeril if needed, hydrocodone up to every 6 hours as needed (don not combine with oxycodone- one or the other).  If not seen by neuro sometime next week - then can follow up with me to check status in next 1 week.  Return to the clinic or go to the nearest emergency room if any of your symptoms worsen or new symptoms occur.  Herniated Disk A herniated disk occurs when a disk in your spine bulges out too far. This condition is also called a ruptured disk or slipped disk. Your spine (backbone) is made up of bones called vertebrae. Between each pair of vertebrae is an oval disk with a soft, spongy center that acts as a shock absorber when you move. The spongy center is surrounded by a tough outer ring. When you have a herniated disk, the spongy center of the disk bulges out or ruptures through the outer ring. A herniated disk can press on a nerve between your vertebrae and cause pain. A herniated disk can occur anywhere in your back or neck area, but the lower back is the most common spot. CAUSES  In many cases, a herniated disk occurs just from getting older. As you age, the spongy insides of your disks tend to shrink and dry out. A herniated disk can result from gradual wear and tear. Injury or sudden strain can also cause a herniated disk.  RISK FACTORS Aging is the main risk factor for a herniated disk. Other risk factors include:  Being a man between the ages of 21 and 90 years.  Having a job that requires heavy lifting, bending, or twisting.  Having a job that requires long hours of driving.  Not getting enough exercise.  Being overweight.  Smoking. SIGNS AND SYMPTOMS  Signs and symptoms depend on which disk is herniated.  For a herniated disk in the lower back, you may  have sharp pain in:  One part of your leg, hip, or buttocks.  The back of your calf.  The top or sole of your foot (sciatica).   For a herniated disk in the neck, you may feel pain:  When you move your neck.  Near or over your shoulder blade.  That moves to your upper arm, forearm, or fingers.   You may also have muscle weakness. It may be hard to:  Lift your leg or arm.  Stand on your toes.  Squeeze tightly with one of your hands.  Other symptoms can include:  Numbness or tingling in the affected areas of your body.  Loss of bladder or bowel control. This is a rare but serious sign of a severe herniated disk in the lower back. DIAGNOSIS  Your health care provider will do a physical exam. During this exam, you may have to move certain body parts or assume various positions. For example, your health care provider may do the straight-leg test. This is a good way to test for a herniated disk in your lower back. In this test, the health care provider lifts your leg while you lie on your back. This is to see if you feel pain down your leg. Your health care provider will also check for numbness or loss of feeling.  Your health care provider will also check your:  Reflexes.  Muscle strength.  Posture.  Other tests may be done to help in making a diagnosis. These may include:  An X-ray of the spine to rule out other causes of back pain.   Other imaging studies, such as an MRI or CT scan. This is to check whether the herniated disk is pressing on your spinal canal.  Electromyography (EMG). This test checks the nerves that control muscles. It is sometimes used to identify the specific area of nerve involvement.  TREATMENT  In many cases, herniated disk symptoms go away over a period of days or weeks. You will most likely be free of symptoms in 3-4 months. Treatment may include the following:  The initial treatment for a herniated disk is ashort period of rest.  Bed rest  is often limited to 1 or 2 days. Resting for too long delays recovery.  If you have a herniated disk in your lower back, you should avoid sitting as much as possible because sitting increases pressure on the disk.  Medicines. These may include:   Nonsteroidal anti-inflammatory drugs (NSAIDs).  Muscle relaxants for back spasms.  Narcotic pain medicine if your pain is very bad.   Steroid injections. You may need these along the involved nerve root to help control pain. The steroid is injected in the area of the herniated disk. It helps by reducing swelling around the disk.  Physical therapy. This may include exercises to strengthen the muscles that help support your spine.   You may need surgery if other treatments do not work.  HOME CARE INSTRUCTIONS Follow all your health care provider's instructions. These may include:  Take all medicines as directed by your health care provider.  Rest for 2 days and then start moving.  Do not sit or stand for long periods of time.  Maintain good posture when sitting and standing.  Avoid movements that cause pain, such as bending or lifting.  When you are able to start lifting things again:  [place] with your knees.  Keep your back straight.  Hold heavy objects close to your body.  If you are overweight, ask your health care provider to help you start a weight-loss program.  When you are able to start exercising, ask your health care provider how much and what type of exercise is best for you.  Work with a physical therapist on stretching and strengthening exercises for your back.  Do not wear high-heeled shoes.  Do not sleep on your belly.  Do not smoke.  Keep all follow-up visits as directed by your health care provider. SEEK MEDICAL CARE IF:  You have back or neck pain that is not getting better after 4 weeks.  You have very bad pain in your back or neck.  You develop numbness, tingling, or weakness along with pain. SEEK  IMMEDIATE MEDICAL CARE IF:   You have numbness, tingling, or weakness that makes you unable to use your arms or legs.  You lose control of your bladder or bowels.  You have dizziness or fainting.  You have shortness of breath.  MAKE SURE YOU:   Understand these instructions.  Will watch your condition.  Will get help right away if you are not doing well or get worse. Document Released: 09/17/2000 Document Revised: 02/04/2014 Document Reviewed: 08/24/2013 [facility] Patient Information 2015 [place], [place]. This information is not intended to replace advice given to you by your health care provider. Make sure you discuss any questions you have with your health care provider.

## 2014-06-07 NOTE — Progress Notes (Signed)
Subjective:    Patient ID: Kelly Simon, female    DOB: 1966-08-26, 47 y.o.   MRN: [id number]  This chart was scribed for Meredith Staggers, MD by Gwenevere Abbot, ED scribe. This patient was seen in room Room/bed 9 and the patient's care was started at 4:26 PM.  [id number], Harrel Lemon, MD  Chief Complaint  Patient presents with  . Follow-up    back pain on the left side    HPI  Kelly Simon is a 48 y.o. female who presents to [facility] for a follow- up appointment concerning back pain on the left side. Pt was seen 8 days ago by me. Pt was seen in ED after being seen by Dr. Conley Rolls, here, for left lower back pain and sciatica.  Pt was treated with a medrol dose pack, flexeril, and roxicet, and referred to Dr. Charlsie Merles from ED visit. MRI performed 2 days ago, notable for small left disc protrusion at L4 L5, that approximates the L4 root and a small disc protrusion at L3 L4 , approximates the L3 nerve root.   Symptoms: Pt reports that she is experiencing a pins and needles sensation. Pt is unable to bare weight completely. Pt has not lost bowel or bladder continence. Pt is not experiencing saddle anesthesia.   Medication: Pt reports that she finished her pack of medrol 3 days ago, but did not experience much relief with medrol dose pack. Pt reports that the roxicet helps her with sleep.   Pt reports that her sugar has run between 90 and 123.     Patient Active Problem List   Diagnosis Date Noted  . Type II or unspecified type diabetes mellitus without mention of complication, uncontrolled 04/12/2014  . Anemia 05/02/2013  . BMI 29.0-29.9,adult 08/16/2012  . Depression with anxiety 07/12/2012   Past Medical History  Diagnosis Date  . Diabetes mellitus without complication   . Depression    Past Surgical History  Procedure Laterality Date  . Abdominal hysterectomy      ADENOMYOSIS; ovaries resected.   Allergies  Allergen Reactions  . Metformin And Related     diarrhea  .  Adhesive [Tape] Rash   Prior to Admission medications   Medication Sig Start Date End Date Taking? Authorizing Provider  albuterol (PROVENTIL HFA;VENTOLIN HFA) 108 (90 BASE) MCG/ACT inhaler Inhale 1-2 puffs into the lungs every 6 (six) hours as needed for wheezing or shortness of breath.   Yes Historical Provider, MD  cyclobenzaprine (FLEXERIL) 5 MG tablet Take 5 mg by mouth 3 (three) times daily as needed for muscle spasms.   Yes Historical Provider, MD  metFORMIN (GLUCOPHAGE-XR) 500 MG 24 hr tablet Take 1,000 mg by mouth 2 (two) times daily.   Yes Historical Provider, MD  oxyCODONE-acetaminophen (PERCOCET/ROXICET) 5-325 MG per tablet Take 1 tablet by mouth every 8 (eight) hours as needed for severe pain.   Yes Historical Provider, MD  sitaGLIPtin (JANUVIA) 100 MG tablet Take 100 mg by mouth daily.   Yes Historical Provider, MD  glipiZIDE (GLUCOTROL) 10 MG tablet Take 10 mg by mouth 2 (two) times daily before a meal.    Historical Provider, MD  methylPREDNISolone (MEDROL DOSEPAK) 4 MG tablet Take as directed on package 05/28/14   Linwood Dibbles, MD   History   Social History  . Marital Status: Single    Spouse Name: [name]    Number of Children: 3  . Years of Education: 12+   Occupational History  . CUSTOMER  SERVICE    Social History Main Topics  . Smoking status: Former Smoker -- 26 years    Types: Cigarettes    Quit date: 04/03/2008  . Smokeless tobacco: Never Used  . Alcohol Use: No  . Drug Use: No  . Sexual Activity: Yes    Partners: Male    Birth Control/ Protection: Surgical   Other Topics Concern  . Not on file   Social History Narrative   Engaged to be married.  Lives with him currently.  Two sons are grown and live independently.  The youngest is 54 and lives with her.     Review of Systems  Musculoskeletal: Positive for back pain.       Objective:   Physical Exam  Nursing note and vitals reviewed. Constitutional: She is oriented to person, place, and time.  She appears well-developed and well-nourished.  HENT:  Head: Normocephalic and atraumatic.  Eyes: EOM are normal.  Neck: Normal range of motion. Neck supple.  Cardiovascular: Normal rate.   Pulmonary/Chest: Effort normal.  Musculoskeletal: Normal range of motion.       Lumbar back: She exhibits tenderness.  Left paraspinal approximately L4 L5 with some spasm. She has slight tenderness then numbness to left sciatic notch and left buttocks.  Strength: Equal in  lower extremities, specifically, quadricept on left equal strength, but discomfort with exam.   Neurological: She is alert and oriented to person, place, and time. She displays no Babinski's sign on the right side. She displays no Babinski's sign on the left side.  Reflex Scores:      Patellar reflexes are 2+ on the right side and 2+ on the left side.      Achilles reflexes are 1+ on the right side and 1+ on the left side. Seated straight leg raise, pull down the back of the left leg.     Skin: Skin is warm and dry.  Psychiatric: She has a normal mood and affect. Her behavior is normal.      Filed Vitals:   06/07/14 1520  BP: 124/78  Pulse: 92  Temp: 98.4 F (36.9 C)  TempSrc: Oral  Resp: 16  Height: 5' 3.75" (1.619 m)  Weight: 197 lb (89.359 kg)  SpO2: 96%   06/05/14 MRI report:IMPRESSION: 1. Small left foraminal disc protrusion at L4-5, closely approximating the left L4 nerve root as it courses out of the left neural foramen. This could potentially result in nerve root irritation. 2. Small extra foraminal/far lateral disc protrusion at L3-4, closely approximating the exiting left L3 nerve root. 3. Otherwise unremarkable MRI of the lumbar spine      Assessment & Plan:   Kelly Simon is a 48 y.o. female Left-sided low back pain with left-sided sciatica - Plan: HYDROcodone-acetaminophen (NORCO/VICODIN) 5-325 MG per tablet, Ambulatory referral to Neurosurgery  Herniated lumbar intervertebral disc - Plan:  Ambulatory referral to Neurosurgery  2 disc protrusions as above, which may be contributing to her symptoms. No relief with steroid taper. Prior referral to neurosurgeon pending. Ok to cont flexeril (refilled), and rx lortab during day if needed. SED, and advised to not combine oxycodone and hydrocodone. If unable to be seen at neurosurgery, consider back specialist and ESI given mild protrusions. rtc/er precautions.   Meds ordered this encounter  Medications  . HYDROcodone-acetaminophen (NORCO/VICODIN) 5-325 MG per tablet    Sig: Take 1 tablet by mouth every 6 (six) hours as needed for moderate pain.    Dispense:  20 tablet  Refill:  0   Patient Instructions  I will have our referrals dept ceck into the appointment with Dr. Jackelyn Knife office, and can send MRI report to them. There were 2 areas of disk protrusions or areas that may be pressing on your back nerves.  Continue flexeril if needed, hydrocodone up to every 6 hours as needed (don not combine with oxycodone- one or the other).  If not seen by neuro sometime next week - then can follow up with me to check status in next 1 week.  Return to the clinic or go to the nearest emergency room if any of your symptoms worsen or new symptoms occur.  Herniated Disk A herniated disk occurs when a disk in your spine bulges out too far. This condition is also called a ruptured disk or slipped disk. Your spine (backbone) is made up of bones called vertebrae. Between each pair of vertebrae is an oval disk with a soft, spongy center that acts as a shock absorber when you move. The spongy center is surrounded by a tough outer ring. When you have a herniated disk, the spongy center of the disk bulges out or ruptures through the outer ring. A herniated disk can press on a nerve between your vertebrae and cause pain. A herniated disk can occur anywhere in your back or neck area, but the lower back is the most common spot. CAUSES  In many cases, a herniated  disk occurs just from getting older. As you age, the spongy insides of your disks tend to shrink and dry out. A herniated disk can result from gradual wear and tear. Injury or sudden strain can also cause a herniated disk.  RISK FACTORS Aging is the main risk factor for a herniated disk. Other risk factors include:  Being a man between the ages of 34 and 44 years.  Having a job that requires heavy lifting, bending, or twisting.  Having a job that requires long hours of driving.  Not getting enough exercise.  Being overweight.  Smoking. SIGNS AND SYMPTOMS  Signs and symptoms depend on which disk is herniated.  For a herniated disk in the lower back, you may have sharp pain in:  One part of your leg, hip, or buttocks.  The back of your calf.  The top or sole of your foot (sciatica).   For a herniated disk in the neck, you may feel pain:  When you move your neck.  Near or over your shoulder blade.  That moves to your upper arm, forearm, or fingers.   You may also have muscle weakness. It may be hard to:  Lift your leg or arm.  Stand on your toes.  Squeeze tightly with one of your hands.  Other symptoms can include:  Numbness or tingling in the affected areas of your body.  Loss of bladder or bowel control. This is a rare but serious sign of a severe herniated disk in the lower back. DIAGNOSIS  Your health care provider will do a physical exam. During this exam, you may have to move certain body parts or assume various positions. For example, your health care provider may do the straight-leg test. This is a good way to test for a herniated disk in your lower back. In this test, the health care provider lifts your leg while you lie on your back. This is to see if you feel pain down your leg. Your health care provider will also check for numbness or loss of feeling.  Your health care provider will also check your:  Reflexes.  Muscle strength.  Posture.  Other  tests may be done to help in making a diagnosis. These may include:  An X-ray of the spine to rule out other causes of back pain.   Other imaging studies, such as an MRI or CT scan. This is to check whether the herniated disk is pressing on your spinal canal.  Electromyography (EMG). This test checks the nerves that control muscles. It is sometimes used to identify the specific area of nerve involvement.  TREATMENT  In many cases, herniated disk symptoms go away over a period of days or weeks. You will most likely be free of symptoms in 3-4 months. Treatment may include the following:  The initial treatment for a herniated disk is ashort period of rest.  Bed rest is often limited to 1 or 2 days. Resting for too long delays recovery.  If you have a herniated disk in your lower back, you should avoid sitting as much as possible because sitting increases pressure on the disk.  Medicines. These may include:   Nonsteroidal anti-inflammatory drugs (NSAIDs).  Muscle relaxants for back spasms.  Narcotic pain medicine if your pain is very bad.   Steroid injections. You may need these along the involved nerve root to help control pain. The steroid is injected in the area of the herniated disk. It helps by reducing swelling around the disk.  Physical therapy. This may include exercises to strengthen the muscles that help support your spine.   You may need surgery if other treatments do not work.  HOME CARE INSTRUCTIONS Follow all your health care provider's instructions. These may include:  Take all medicines as directed by your health care provider.  Rest for 2 days and then start moving.  Do not sit or stand for long periods of time.  Maintain good posture when sitting and standing.  Avoid movements that cause pain, such as bending or lifting.  When you are able to start lifting things again:  [place] with your knees.  Keep your back straight.  Hold heavy objects close to  your body.  If you are overweight, ask your health care provider to help you start a weight-loss program.  When you are able to start exercising, ask your health care provider how much and what type of exercise is best for you.  Work with a physical therapist on stretching and strengthening exercises for your back.  Do not wear high-heeled shoes.  Do not sleep on your belly.  Do not smoke.  Keep all follow-up visits as directed by your health care provider. SEEK MEDICAL CARE IF:  You have back or neck pain that is not getting better after 4 weeks.  You have very bad pain in your back or neck.  You develop numbness, tingling, or weakness along with pain. SEEK IMMEDIATE MEDICAL CARE IF:   You have numbness, tingling, or weakness that makes you unable to use your arms or legs.  You lose control of your bladder or bowels.  You have dizziness or fainting.  You have shortness of breath.  MAKE SURE YOU:   Understand these instructions.  Will watch your condition.  Will get help right away if you are not doing well or get worse. Document Released: 09/17/2000 Document Revised: 02/04/2014 Document Reviewed: 08/24/2013 [facility] Patient Information 2015 [place], [place]. This information is not intended to replace advice given to you by your health care provider. Make sure you discuss  any questions you have with your health care provider.     I personally performed the services described in this documentation, which was scribed in my presence. The recorded information has been reviewed and considered, and addended by me as needed.

## 2014-06-11 ENCOUNTER — Encounter: Payer: Self-pay | Admitting: Internal Medicine

## 2014-06-11 ENCOUNTER — Encounter: Payer: Self-pay | Admitting: Family Medicine

## 2014-06-11 ENCOUNTER — Telehealth: Payer: Self-pay | Admitting: Family Medicine

## 2014-06-11 ENCOUNTER — Ambulatory Visit (INDEPENDENT_AMBULATORY_CARE_PROVIDER_SITE_OTHER): Payer: BC Managed Care – PPO | Admitting: Internal Medicine

## 2014-06-11 VITALS — BP 112/72 | HR 102 | Temp 97.9°F | Resp 12 | Wt 199.0 lb

## 2014-06-11 DIAGNOSIS — IMO0001 Reserved for inherently not codable concepts without codable children: Secondary | ICD-10-CM

## 2014-06-11 DIAGNOSIS — E1165 Type 2 diabetes mellitus with hyperglycemia: Principal | ICD-10-CM

## 2014-06-11 NOTE — Telephone Encounter (Signed)
Patient dropped off FMLA forms on 06/07/2014 for completion by Dr. Carlota Raspberry. States that it is for herniated disk. Patient's last OV was 06/07/2014. Awaiting completion between 5-7 business days. Will notify patient when it is complete and will fax form to [employer] as patient indicated on her form.

## 2014-06-11 NOTE — Progress Notes (Signed)
Patient ID: Kelly Simon, female   DOB: 1965-10-15, 48 y.o.   MRN: [id number]  HPI: Kelly Simon is a 48 y.o.-year-old female, returning for f/u for DM2, dx 2006, non-insulin-dependent, uncontrolled, without complications.  She has back pain radiating to her legs >> has 2 herniated disks in back >> saw PCP >> had a steroid taper, started Flexeril, opiates (could not tolerate them) >> will see neurosurgery next week.34  Last hemoglobin A1c was: Lab Results  Component Value Date   HGBA1C >14.0 04/12/2014   HGBA1C >14.0 11/15/2013   HGBA1C 6.0 05/02/2013  She was taken off her diabetes meds last year as her A1c was great. She was restarted on them, then ran out and could not refill them >> sugars got out of control (in the 400s).  Pt is on a regimen of: - Januvia 100 mg in am - Metformin 1000 mg bid - added 04/2014 She stopped Glipizide 5 mg bid b/c of low CBGs. She had problems with Metformin - tried this in 2011: abdominal cramps and diarrhea. She only tried this for 1 day when she took all 4 tablets.  Pt checks her sugars 4 a day and they are: - am: 250-268 >> 84-98 - 2.5 h after b'fast: 250-260 >> n/c - before lunch: n/c >> 93-104 - 2h after lunch: 260s >> n/c - before dinner: 260s >> 79-97 - 2h after dinner: n/c >> n/c - bedtime: n/c >> 78-90 She had lows before stopping Glipizide (47, 58) >> she saw PCP >> he advised her to stop Glipizide. Lowest sugar was 251 >> 34, 58; she has hypoglycemia awareness at 60s.  Highest sugar was 305 >> 104  She works in [employer] - commutes 2h each way - leaves home at 8 am and comes back home at 12 am. Kelly Simon has been off work in the last 2 weeks 2/2 back pain. She lost 139 lbs - increased exercise and water intake - but had a more normal schedule at that time. Since she started this job she started to gain weight back.  Pt's meals are: - Breakfast: yoghurt or bowl of grits/coffee/banana - Lunch + Dinner: grilled chicken salad -  Snacks: veggies, bananas, oranges, crackers; no sodas  - no CKD, last BUN/creatinine:  Lab Results  Component Value Date   BUN 9 04/12/2014   CREATININE 0.59 04/12/2014  She is not on an ACEI/ARB. - last set of lipids: Lab Results  Component Value Date   CHOL 177 07/12/2012   HDL 42 07/12/2012   LDLCALC 104* 07/12/2012   TRIG 155* 07/12/2012   CHOLHDL 4.2 07/12/2012  She is not on a statin. - last eye exam was in 04/2014. No DR. Has cataracts.  - + numbness, but no tingling in her feet.  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  ROS: Constitutional: + weight gain, + increased appetite, no fatigue, no subjective hyperthermia/hypothermia Eyes: no blurry vision, no xerophthalmia ENT: no sore throat, no nodules palpated in throat, dysphagia/odynophagia, no hoarseness Cardiovascular: no CP/SOB/palpitations/leg swelling Respiratory: no cough/SOB Gastrointestinal: no N/V/D/C Musculoskeletal: no muscle/joint aches, + back pain Skin: no rashes Neurological: no tremors/numbness/tingling/dizziness  PE: BP 112/72  Pulse 102  Temp(Src) 97.9 F (36.6 C) (Oral)  Resp 12  Wt 199 lb (90.266 kg)  SpO2 98%   Wt Readings from Last 3 Encounters:  06/11/14 199 lb (90.266 kg)  06/07/14 197 lb (89.359 kg)  05/30/14 195 lb 6.4 oz (88.633 kg)   Constitutional: overweight, in NAD, antalgic gait Eyes: PERRLA, EOMI, no exophthalmos ENT: moist mucous membranes, no thyromegaly, no cervical lymphadenopathy Cardiovascular: RRR, No MRG Respiratory: CTA B Gastrointestinal: abdomen soft, NT, ND, BS+ Musculoskeletal: no deformities, strength intact in all 4 Skin: moist, warm, no rashes Neurological: no tremor with outstretched hands, DTR normal in all 4  ASSESSMENT: 1. DM2, non-insulin-dependent, uncontrolled, without complications  PLAN:  1. Patient with long-standing, recently more controlled diabetes, on oral antidiabetic regimen - with  Januvia and Metformin XR - she tolerates the metformin well (occasional diarrhea, not bothersome) - We discussed about options for treatment - will continue same regimen for now:  Patient Instructions  Please continue Metformin 1000 mg 2x a day. Continue Januvia 100 mg daily in am. Please return in 3 months with your sugar log.  - continue checking sugars at different times of the day - check 2 times a day, rotating checks - advised for yearly eye exams >> she is up to date - discussed about diet >> suggested healthy changes (see pt instructions) - Return to clinic in 3 mo with sugar log

## 2014-06-11 NOTE — Patient Instructions (Addendum)
Please continue Metformin 1000 mg 2x a day. Continue Januvia 100 mg daily in am.  Please return in 3 months with your sugar log.   Please consider the following ways to cut down carbs and fat and increase fiber and micronutrients in your diet:  - substitute whole grain for white bread or pasta - substitute brown rice for white rice - substitute 90-calorie flat bread pieces for slices of bread when possible - substitute sweet potatoes or yams for white potatoes - substitute humus for margarine - substitute tofu for cheese when possible - substitute almond or rice milk for regular milk (would not drink soy milk daily due to concern for soy estrogen influence on breast cancer risk) - substitute dark chocolate for other sweets when possible - substitute water - can add lemon or orange slices for taste - for diet sodas (artificial sweeteners will trick your body that you can eat sweets without getting calories and will lead you to overeating and weight gain in the long run) - do not skip breakfast or other meals (this will slow down the metabolism and will result in more weight gain over time)  - can try smoothies made from fruit and almond/rice milk in am instead of regular breakfast - can also try old-fashioned (not instant) oatmeal made with almond/rice milk in am - order the dressing on the side when eating salad at a restaurant (pour less than half of the dressing on the salad) - eat as little meat as possible - can try juicing, but should not forget that juicing will get rid of the fiber, so would alternate with eating raw veg./fruits or drinking smoothies - use as little oil as possible, even when using olive oil - can dress a salad with a mix of balsamic vinegar and lemon juice, for e.g. - use agave nectar, stevia sugar, or regular sugar rather than artificial sweateners - steam or broil/roast veggies  - snack on veggies/fruit/nuts (unsalted, preferably) when possible, rather than  processed foods - reduce or eliminate aspartame in diet (it is in diet sodas, chewing gum, etc) Read the labels!  Try to read Dr. Katherina Right book: "Program for Reversing Diabetes" for the vegan concept and other ideas for healthy eating.

## 2014-06-14 ENCOUNTER — Ambulatory Visit (INDEPENDENT_AMBULATORY_CARE_PROVIDER_SITE_OTHER): Payer: BC Managed Care – PPO | Admitting: Family Medicine

## 2014-06-14 VITALS — BP 120/80 | HR 115 | Temp 98.0°F | Resp 17 | Ht 63.5 in | Wt 197.0 lb

## 2014-06-14 DIAGNOSIS — M5106 Intervertebral disc disorders with myelopathy, lumbar region: Secondary | ICD-10-CM

## 2014-06-14 DIAGNOSIS — Z0271 Encounter for disability determination: Secondary | ICD-10-CM

## 2014-06-14 DIAGNOSIS — M5442 Lumbago with sciatica, left side: Secondary | ICD-10-CM | POA: Insufficient documentation

## 2014-06-14 DIAGNOSIS — M5430 Sciatica, unspecified side: Secondary | ICD-10-CM

## 2014-06-14 MED ORDER — METHYLPREDNISOLONE 4 MG PO KIT: PACK | ORAL | Status: DC

## 2014-06-14 MED ORDER — CYCLOBENZAPRINE HCL 5 MG PO TABS: 5.0000 mg | ORAL_TABLET | Freq: Three times a day (TID) | ORAL | Status: DC | PRN

## 2014-06-14 MED ORDER — HYDROCODONE-ACETAMINOPHEN 5-325 MG PO TABS: 1.0000 | ORAL_TABLET | ORAL | Status: DC | PRN

## 2014-06-14 NOTE — Progress Notes (Signed)
Subjective: Patient continues to have severe back pain radiating into her buttock and now down her leg. She basically is just laying around. Has not been able to work for a couple of weeks. Is waiting to get into a neurosurgeon. Has not heard anything from them. Records have been sent. I talk about whether they should try go [facility] one [facility] in an expedited fashion, but I do not know what the timetable would be anywhere else.  Objective: Significant pain. Abdomen soft. Straight leg raise test negative on the right. On the left SLR is positive at about 15 with pain radiating down her leg.  Assessment: Lumbar disease with lumbar radiculopathy and intense pain  Plan: Repeat her steroids. Her sugars been in the 90s at home. Gave more pain medication and muscle relaxant. She is to periodically check with the neurosurgery office and see if they know when I can see her. If they desire to check on the [place] [facility] they can do so, but I expect will go through in a fairly prompt fashion.  Return in 1-6 weeks to see Dr. Chilton Si if needed.

## 2014-06-14 NOTE — Patient Instructions (Signed)
Continued to periodically check with the neurosurgeon and see if they have any idea when you can see him.  It is your option if you choose to check at the [facility] centers and see what they have available.  You can take the hydrocodone pain pills every 4 hours if needed for pain  Take the repeat Medrol Dosepak as directed  Continue the muscle relaxant, cyclobenzaprine, Flexeril, one daily  Advise taking a stool softener such as Colace one daily  If you seem constipated and I'll go ahead and take a dose of MiraLax, this is a fairly safe a gentle laxative.  Returns in one to 2 weeks to follow up with Kelly Simon or as needed.

## 2014-06-21 ENCOUNTER — Telehealth: Payer: Self-pay

## 2014-06-21 NOTE — Telephone Encounter (Signed)
PATIENT REQUESTING X-RAY  FROM AUG 23  SHE WILL PICK UP TODAY

## 2014-06-25 ENCOUNTER — Telehealth: Payer: Self-pay | Admitting: Family Medicine

## 2014-06-25 NOTE — Telephone Encounter (Signed)
Patient left message on Disabilities VM about an Attending Physician's statement form that was faxed over from [facility]. Informed patient that we have not received it. I will call [employer] to inquire about this form and have them refax it. I will deliver it to the appropriate provider.

## 2014-08-08 ENCOUNTER — Other Ambulatory Visit: Payer: Self-pay | Admitting: *Deleted

## 2014-08-08 MED ORDER — METFORMIN HCL ER 500 MG PO TB24: 1000.0000 mg | ORAL_TABLET | Freq: Two times a day (BID) | ORAL | Status: DC

## 2014-08-20 ENCOUNTER — Other Ambulatory Visit (HOSPITAL_COMMUNITY): Payer: Self-pay | Admitting: Neurosurgery

## 2014-08-20 DIAGNOSIS — M5416 Radiculopathy, lumbar region: Secondary | ICD-10-CM

## 2014-08-26 ENCOUNTER — Other Ambulatory Visit: Payer: Self-pay | Admitting: Neurosurgery

## 2014-08-26 DIAGNOSIS — M5416 Radiculopathy, lumbar region: Secondary | ICD-10-CM

## 2014-08-27 ENCOUNTER — Ambulatory Visit
Admission: RE | Admit: 2014-08-27 | Discharge: 2014-08-27 | Disposition: A | Discharge status: Home / Self Care | Payer: BC Managed Care – PPO | Source: Ambulatory Visit | Attending: Neurosurgery | Admitting: Neurosurgery

## 2014-08-27 DIAGNOSIS — M5416 Radiculopathy, lumbar region: Secondary | ICD-10-CM

## 2014-08-27 DIAGNOSIS — M5442 Lumbago with sciatica, left side: Secondary | ICD-10-CM

## 2014-08-27 DIAGNOSIS — M5106 Intervertebral disc disorders with myelopathy, lumbar region: Secondary | ICD-10-CM

## 2014-08-27 MED ORDER — ONDANSETRON HCL 4 MG/2ML IJ SOLN: 4.0000 mg | Freq: Four times a day (QID) | INTRAMUSCULAR | Status: DC | PRN

## 2014-08-27 MED ORDER — MEPERIDINE HCL 100 MG/ML IJ SOLN
75.0000 mg | Freq: Once | INTRAMUSCULAR | Status: AC
  Administered 2014-08-27: 75 mg via INTRAMUSCULAR

## 2014-08-27 MED ORDER — ONDANSETRON HCL 4 MG/2ML IJ SOLN
4.0000 mg | Freq: Once | INTRAMUSCULAR | Status: AC
  Administered 2014-08-27: 4 mg via INTRAMUSCULAR

## 2014-08-27 MED ORDER — IOHEXOL 180 MG/ML  SOLN
15.0000 mL | Freq: Once | INTRAMUSCULAR | Status: AC | PRN
  Administered 2014-08-27: 15 mL via INTRATHECAL

## 2014-08-27 MED ORDER — DIAZEPAM 5 MG PO TABS
10.0000 mg | ORAL_TABLET | Freq: Once | ORAL | Status: AC
  Administered 2014-08-27: 10 mg via ORAL

## 2014-08-27 NOTE — Discharge Instructions (Signed)

## 2014-09-10 ENCOUNTER — Ambulatory Visit (INDEPENDENT_AMBULATORY_CARE_PROVIDER_SITE_OTHER): Payer: BC Managed Care – PPO | Admitting: Internal Medicine

## 2014-09-10 VITALS — BP 118/74 | HR 97 | Temp 98.7°F | Resp 12 | Wt 217.0 lb

## 2014-09-10 DIAGNOSIS — E1165 Type 2 diabetes mellitus with hyperglycemia: Secondary | ICD-10-CM

## 2014-09-10 DIAGNOSIS — IMO0001 Reserved for inherently not codable concepts without codable children: Secondary | ICD-10-CM

## 2014-09-10 LAB — LIPID PANEL
CHOL/HDL RATIO: 3
Cholesterol: 136 mg/dL (ref 0–200)
HDL: 40.9 mg/dL (ref >39.00)
LDL CALC: 67 mg/dL (ref 0–99)
NonHDL: 95.1
TRIGLYCERIDES: 141 mg/dL (ref 0.0–149.0)
VLDL: 28.2 mg/dL (ref 0.0–40.0)

## 2014-09-10 LAB — HEMOGLOBIN A1C: Hgb A1c MFr Bld: 7.2 % — ABNORMAL HIGH (ref 4.6–6.5)

## 2014-09-10 LAB — HM DIABETES FOOT EXAM: HM Diabetic Foot Exam: NORMAL

## 2014-09-10 NOTE — Patient Instructions (Signed)
Please continue Metformin 1000 mg 2x a day. Continue Januvia 100 mg daily in am.  Please return in 3 months with your sugar log.  Please stop at the lab.

## 2014-09-10 NOTE — Progress Notes (Signed)
Patient ID: Kelly Simon, female   DOB: 06/19/1966, 48 y.o.   MRN: [id number]  HPI: Kelly Simon is a 48 y.o.-year-old female, returning for f/u for DM2, dx 2006, non-insulin-dependent, uncontrolled, without complications. Last visit 3 mo ago.  She was again on steroids for back pain in 09-08/2014: Prednisone taper pack, then injections in back. Sugars spiked then.  Last hemoglobin A1c was: Lab Results  Component Value Date   HGBA1C >14.0 04/12/2014   HGBA1C >14.0 11/15/2013   HGBA1C 6.0 05/02/2013  She was taken off her diabetes meds last year as her A1c was great. She was restarted on them, then ran out and could not refill them >> sugars got out of control (in the 400s).  Pt is on a regimen of: - Januvia 100 mg in am - Metformin XR 1000 mg bid - added 04/2014 >> some diarrhea She stopped Glipizide 5 mg bid b/c of low CBGs. She had problems with Metformin - tried this in 2011: abdominal cramps and diarrhea.   Pt checks her sugars 2-3x a day and they are (no log or meter): - am: 250-268 >> 84-98 >> 80s - 2.5 h after b'fast: 250-260 >> n/c - before lunch: n/c >> 93-104 >> 92-96, if more active: 80s - 2h after lunch: 260s >> n/c - before dinner: 260s >> 79-97 >> 88-90s - 2h after dinner: n/c >> n/c - bedtime: n/c >> 78-90 >> n/c She had lows before stopping Glipizide (34, 58) >> she saw PCP >> he advised her to stop Glipizide; she has hypoglycemia awareness at 60s.  Highest sugar was 305 >> 104  She has been off work 2/2 back pain. She does PT.  She has lost 139 lbs - increased exercise and water intake - but had a more normal schedule at that time. Since she started this job she started to gain weight back >> 20 lbs in last 3 mo!!!  Pt's meals are: - Breakfast: yoghurt or bowl of grits/coffee/banana - Lunch + Dinner: grilled chicken salad - Snacks: veggies, bananas, oranges, crackers; no sodas  - no CKD, last BUN/creatinine:  Lab Results  Component Value Date    BUN 9 04/12/2014   CREATININE 0.59 04/12/2014  She is not on an ACEI/ARB. - last set of lipids: Lab Results  Component Value Date   CHOL 177 07/12/2012   HDL 42 07/12/2012   LDLCALC 104* 07/12/2012   TRIG 155* 07/12/2012   CHOLHDL 4.2 07/12/2012  She is not on a statin. - last eye exam was in 04/2014. No DR. Has cataracts.  - + numbness, but no tingling in her feet.  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  ROS: Constitutional: + weight gain, + increased appetite, no fatigue, no subjective hyperthermia/hypothermia Eyes: no blurry vision, no xerophthalmia ENT: no sore throat, no nodules palpated in throat, dysphagia/odynophagia, no hoarseness Cardiovascular: no CP/SOB/palpitations/leg swelling Respiratory: no cough/SOB Gastrointestinal: no N/V/D/C Musculoskeletal: no muscle/joint aches, + back pain Skin: no rashes Neurological: no tremors/numbness/tingling/dizziness  I reviewed pt's medications, allergies, PMH, social hx, family hx and no changes required, except as mentioned above. Started Flexeril.  PE: BP 118/74 mmHg  Pulse 97  Temp(Src) 98.7 F (37.1 C) (Oral)  Resp 12  Wt 217 lb (98.431 kg)  SpO2 96%   Wt Readings from Last 3 Encounters:  09/10/14 217 lb (98.431 kg)  06/14/14 197 lb (89.359 kg)  06/11/14 199  lb (90.266 kg)   Constitutional: overweight, in NAD Eyes: PERRLA, EOMI, no exophthalmos ENT: moist mucous membranes, no thyromegaly, no cervical lymphadenopathy Cardiovascular: RRR, No MRG Respiratory: CTA B Gastrointestinal: abdomen soft, NT, ND, BS+ Musculoskeletal: no deformities, strength intact in all 4 Skin: moist, warm, no rashes Neurological: no tremor with outstretched hands, DTR normal in all 4  ASSESSMENT: 1. DM2, non-insulin-dependent, uncontrolled, without complications  PLAN:  1. Patient with long-standing, recently well controlled diabetes, on oral antidiabetic regimen -  with Januvia and Metformin XR - she tolerates the metformin well (occasional diarrhea, not bothersome). - We discussed about options for treatment - will continue same regimen for now:  Patient Instructions  Please continue Metformin 1000 mg 2x a day. Continue Januvia 100 mg daily in am. Please return in 3 months with your sugar log.  - continue checking sugars at different times of the day - check 2 times a day, rotating checks - advised for yearly eye exams >> she is up to date - Return to clinic in 3 mo with sugar log   Office Visit on 09/10/2014  Component Date Value Ref Range Status  . Cholesterol 09/10/2014 136  0 - 200 mg/dL Final   ATP III Classification       Desirable:  < 200 mg/dL               Borderline High:  200 - 239 mg/dL          High:  > = 161240 mg/dL  . Triglycerides 09/10/2014 141.0  0.0 - 149.0 mg/dL Final   Normal:  <096<150 mg/dLBorderline High:  150 - 199 mg/dL  . HDL 09/10/2014 40.90  >39.00 mg/dL Final  . VLDL 04/54/098112/05/2014 28.2  0.0 - 40.0 mg/dL Final  . LDL Cholesterol 09/10/2014 67  0 - 99 mg/dL Final  . Total CHOL/HDL Ratio 09/10/2014 3   Final                  Men          Women1/2 Average Risk     3.4          3.3Average Risk          5.0          4.42X Average Risk          9.6          7.13X Average Risk          15.0          11.0                      . NonHDL 09/10/2014 95.10   Final   NOTE:  Non-HDL goal should be 30 mg/dL higher than patient's LDL goal (i.e. LDL goal of < 70 mg/dL, would have non-HDL goal of < 100 mg/dL)  . Hgb A1c MFr Bld 09/10/2014 7.2* 4.6 - 6.5 % Final   Glycemic Control Guidelines for People with Diabetes:Non Diabetic:  <6%Goal of Therapy: <7%Additional Action Suggested:  >8%   . HM Diabetic Eye Exam 04/03/2014 No Retinopathy  No Retinopathy Final  . HM Diabetic Foot Exam 09/10/2014 normal   Final   Impressive improvement in HbA1c! LIpids also better!

## 2014-09-12 ENCOUNTER — Encounter: Payer: Self-pay | Admitting: Internal Medicine

## 2014-09-16 ENCOUNTER — Ambulatory Visit (HOSPITAL_COMMUNITY): Admission: RE | Admit: 2014-09-16 | Payer: BC Managed Care – PPO | Source: Ambulatory Visit

## 2014-09-16 ENCOUNTER — Ambulatory Visit (HOSPITAL_COMMUNITY): Payer: BC Managed Care – PPO

## 2014-09-17 ENCOUNTER — Ambulatory Visit (HOSPITAL_COMMUNITY): Payer: BC Managed Care – PPO

## 2014-10-31 ENCOUNTER — Encounter: Payer: Self-pay | Admitting: Neurology

## 2014-10-31 ENCOUNTER — Ambulatory Visit (INDEPENDENT_AMBULATORY_CARE_PROVIDER_SITE_OTHER): Payer: BLUE CROSS/BLUE SHIELD | Admitting: Neurology

## 2014-10-31 VITALS — BP 127/88 | HR 92 | Ht 64.0 in | Wt 221.0 lb

## 2014-10-31 DIAGNOSIS — M545 Low back pain, unspecified: Secondary | ICD-10-CM

## 2014-10-31 DIAGNOSIS — M25552 Pain in left hip: Secondary | ICD-10-CM

## 2014-10-31 NOTE — Progress Notes (Signed)
PATIENT: Kelly Simon DOB: 02-15-1966  HISTORICAL  Kelly Simon is a 49 yo RH AAF referred by neurosurgeon Dr. Mikal Plane, and primary care physician Dollitte for evaluation of low back pain, left leg discomfort.  She had a past medical history of diabetes, in August 20 second 2015, she was at work, she felt sharp pain in her back, radiating pain to her left hip, left leg, numbness wrapping around her left hip, to her left groin area, intermittent, but moderate to severe, actually presented to the emergency room,  She was treated with steroid package twice, September, again October 2015, only had temporary relief  She continued to have significant low back pain, radiating pain to her left hip, left lateral leg, left leg paresthesia,  Had extensive evaluation, and treatment,  I have reviewed MRI lumbar September 2015 at [facility] imaging: Small left foraminal disc protrusion at L4-5, closely approximating the left L4 nerve root as it courses out of the left neural foramen. This could potentially result in nerve root Irritation. Small extra foraminal/far lateral disc protrusion at L3-4, closely approximating the exiting left L3 nerve root. Otherwise unremarkable MRI of the lumbar spine.  CT myelogram: Mild L3-L4 disc degeneration and RIGHT facet arthrosis. No stenosis. L4-L5 disc degeneration with shallow broad-based posterior disc bulge eccentric to the LEFT. In the LEFT lateral region, this contacts the exited LEFT L4 nerve and could produce irritation. Mild RIGHT facet arthrosis at L4-L5.  She has tried physical therapy, different medications, narcotics, muscle relaxants, lumbar epidural injection, without helping her symptoms,  Reported normal EMG nerve conduction study from outside facility   REVIEW OF SYSTEMS: Full 14 system review of systems performed and notable only for as above  ALLERGIES: Allergies  Allergen Reactions  . Metformin And Related     diarrhea    . Adhesive [Tape] Rash    HOME MEDICATIONS: Current Outpatient Prescriptions  Medication Sig Dispense Refill  . albuterol (PROVENTIL HFA;VENTOLIN HFA) 108 (90 BASE) MCG/ACT inhaler Inhale 1-2 puffs into the lungs every 6 (six) hours as needed for wheezing or shortness of breath.    . cyclobenzaprine (FLEXERIL) 5 MG tablet Take 1 tablet (5 mg total) by mouth 3 (three) times daily as needed for muscle spasms. 30 tablet 1  . JANUVIA 100 MG tablet     . metFORMIN (GLUCOPHAGE-XR) 500 MG 24 hr tablet Take 2 tablets (1,000 mg total) by mouth 2 (two) times daily. 360 tablet 1  . methylPREDNISolone (MEDROL DOSEPAK) 4 MG tablet Take as directed on package 21 tablet 0  . ONE TOUCH ULTRA TEST test strip     . traMADol (ULTRAM) 50 MG tablet QID.  0   No current facility-administered medications for this visit.    PAST MEDICAL HISTORY: Past Medical History  Diagnosis Date  . Diabetes mellitus without complication   . Depression   . Low back pain   . Numbness and tingling of left leg     PAST SURGICAL HISTORY: Past Surgical History  Procedure Laterality Date  . Abdominal hysterectomy  2005    ADENOMYOSIS; ovaries resected.  . Cryoablation  2004  . Laparoscopic abdominal exploration    . Upper gastrointestinal endoscopy      FAMILY HISTORY: Family History  Problem Relation Age of Onset  . Cancer Mother     Cervical s/p hysterectomy; mets to the abdomen  . Hypertension Mother   . Cancer Father     prostate cancer    SOCIAL  HISTORY:  History   Social History  . Marital Status: Single    Spouse Name: Cristal DeerChristopher    Number of Children: 3  . Years of Education: 12+   Occupational History  . CUSTOMER SERVICE SUPERVISOR    Social History Main Topics  . Smoking status: Former Smoker -- 26 years    Types: Cigarettes    Quit date: 04/03/2008  . Smokeless tobacco: Never Used  . Alcohol Use: 0.0 oz/week    0 Not specified per week     Comment: Occasionally  . Drug Use: No  .  Sexual Activity:    Partners: Male    Birth Control/ Protection: Surgical   Other Topics Concern  . Not on file   Social History Narrative   Lives at home with husband.    Three sons are grown and live independently.    Right-handed.   2 cups caffeine/day.     PHYSICAL EXAM   Filed Vitals:   10/31/14 0816  BP: 127/88  Pulse: 92  Height: 5\' 4"  (1.626 m)  Weight: 221 lb (100.245 kg)    Not recorded      Body mass index is 37.92 kg/(m^2).   Generalized: In no acute distress  Neck: Supple, no carotid bruits   Cardiac: Regular rate rhythm  Pulmonary: Clear to auscultation bilaterally  Musculoskeletal: No deformity  Neurological examination  Mentation: Alert oriented to time, place, history taking, and causual conversation  Cranial nerve II-XII: Pupils were equal round reactive to light. Extraocular movements were full.  Visual field were full on confrontational test. Bilateral fundi were sharp.  Facial sensation and strength were normal. Hearing was intact to finger rubbing bilaterally. Uvula tongue midline.  Head turning and shoulder shrug and were normal and symmetric.Tongue protrusion into cheek strength was normal.  Motor:Variable effort, no significant weakness,  Sensory: Intact to fine touch, pinprick, preserved vibratory sensation, and proprioception at toes.  Coordination: Normal finger to nose, heel-to-shin bilaterally there was no truncal ataxia  Gait:  atalgic gait, dragging her left side.  Deep tendon reflexes: Brachioradialis 2/2, biceps 2/2, triceps 2/2, patellar 2/2, Achilles 2/2, plantar responses were flexor bilaterally.   DIAGNOSTIC DATA (LABS, IMAGING, TESTING) - I reviewed patient records, labs, notes, testing and imaging myself where available.  Lab Results  Component Value Date   WBC 11.8* 05/30/2014   HGB 13.7 05/30/2014   HCT 44.9 05/30/2014   MCV 87.8 05/30/2014   PLT 298 04/12/2014      Component Value Date/Time   NA 141  04/12/2014 1330   K 3.9 04/12/2014 1330   CL 100 04/12/2014 1330   CO2 21 04/12/2014 1330   GLUCOSE 218* 04/12/2014 1330   BUN 9 04/12/2014 1330   CREATININE 0.59 04/12/2014 1330   CREATININE 0.79 04/12/2014 1123   CALCIUM 9.6 04/12/2014 1330   PROT 7.6 04/12/2014 1330   ALBUMIN 4.0 04/12/2014 1330   AST 19 04/12/2014 1330   ALT 17 04/12/2014 1330   ALKPHOS 130* 04/12/2014 1330   BILITOT 0.3 04/12/2014 1330   GFRNONAA >90 04/12/2014 1330   GFRAA >90 04/12/2014 1330   Lab Results  Component Value Date   CHOL 136 09/10/2014   HDL 40.90 09/10/2014   LDLCALC 67 09/10/2014   TRIG 141.0 09/10/2014   CHOLHDL 3 09/10/2014   Lab Results  Component Value Date   HGBA1C 7.2* 09/10/2014   No results found for: [id number] Lab Results  Component Value Date   TSH 1.174 05/02/2013  ASSESSMENT AND PLAN  Kelly Simon is a 49 y.o. female  Presented with acute onset of left-sided low back pain, left hip pain, since August 2015, failed physical therapy, medication treatment, epidural injection,   Need to rule out left hip pathology,  X-ray of left hip,  She is going to receive left SI joint injection, return to clinic in 2 months,  Levert Feinstein, M.D. Ph.D.  [facility] [address] [place], [place] [address] Phone: [phone] Fax:      [phone] Levert Feinstein, M.D. Ph.D.  [facility] [address] [place], [place] [address] [phone]

## 2014-11-06 ENCOUNTER — Ambulatory Visit
Admission: RE | Admit: 2014-11-06 | Discharge: 2014-11-06 | Disposition: A | Discharge status: Home / Self Care | Payer: BLUE CROSS/BLUE SHIELD | Source: Ambulatory Visit | Attending: Neurology | Admitting: Neurology

## 2014-11-06 DIAGNOSIS — M545 Low back pain, unspecified: Secondary | ICD-10-CM

## 2014-11-06 DIAGNOSIS — M25552 Pain in left hip: Secondary | ICD-10-CM

## 2014-12-10 ENCOUNTER — Ambulatory Visit (INDEPENDENT_AMBULATORY_CARE_PROVIDER_SITE_OTHER): Payer: BLUE CROSS/BLUE SHIELD | Admitting: Internal Medicine

## 2014-12-10 ENCOUNTER — Other Ambulatory Visit: Payer: Self-pay | Admitting: *Deleted

## 2014-12-10 ENCOUNTER — Encounter: Payer: Self-pay | Admitting: Internal Medicine

## 2014-12-10 VITALS — BP 118/66 | HR 94 | Temp 98.1°F | Resp 12 | Wt 223.0 lb

## 2014-12-10 DIAGNOSIS — E1165 Type 2 diabetes mellitus with hyperglycemia: Secondary | ICD-10-CM

## 2014-12-10 DIAGNOSIS — IMO0001 Reserved for inherently not codable concepts without codable children: Secondary | ICD-10-CM

## 2014-12-10 LAB — HEMOGLOBIN A1C: HEMOGLOBIN A1C: 6.8 % — AB (ref 4.6–6.5)

## 2014-12-10 MED ORDER — METFORMIN HCL ER 500 MG PO TB24: 1000.0000 mg | ORAL_TABLET | Freq: Two times a day (BID) | ORAL | Status: DC

## 2014-12-10 MED ORDER — GLUCOSE BLOOD VI STRP: 1.0000 | ORAL_STRIP | Freq: Three times a day (TID) | Status: DC

## 2014-12-10 MED ORDER — SITAGLIPTIN PHOSPHATE 100 MG PO TABS: 100.0000 mg | ORAL_TABLET | Freq: Every day | ORAL | Status: DC

## 2014-12-10 NOTE — Patient Instructions (Signed)
Patient Instructions  Please continue Metformin 1000 mg 2x a day. Continue Januvia 100 mg daily in am.  Please return in 3 months with your sugar log.   Please stop at the lab.

## 2014-12-10 NOTE — Progress Notes (Signed)
Patient ID: Kelly Simon, female   DOB: 05/07/1966, 49 y.o.   MRN: [id number]  HPI: Kelly Simon is a 49 y.o.-year-old female, returning for f/u for DM2, dx 2006, non-insulin-dependent, uncontrolled, without complications. Last visit 3 mo ago.  She has a URI - last 2 days  - sugars higher.  Last hemoglobin A1c was: Lab Results  Component Value Date   HGBA1C 7.2* 09/10/2014   HGBA1C >14.0 04/12/2014   HGBA1C >14.0 11/15/2013  She was on steroids for back pain in 09-08/2014: Prednisone taper pack, then injections in back. Sugars spiked then. She was taken off her diabetes meds in 2014 as her A1c was great >> sugars got out of control (in the 400s).  Pt is on a regimen of: - Januvia 100 mg in am - Metformin XR 1000 mg bid - added 04/2014 >> some diarrhea  She stopped Glipizide 5 mg bid b/c of low CBGs. She had problems with Metformin - tried this in 2011: abdominal cramps and diarrhea.   Pt checks her sugars 2-3x a day and they are (no log or meter): - am: 250-268 >> 84-98 >> 80s >> 92-94 - 2.5 h after b'fast: 250-260 >> n/c - before lunch: n/c >> 93-104 >> 92-96, if more active: 80s >> 98-103 - 2h after lunch: 260s >> n/c - before dinner: 260s >> 79-97 >> 88-90s >> n/c - 2h after dinner: n/c >> n/c >> 115-118 - bedtime: n/c >> 78-90 >> n/c She had lows before stopping Glipizide (34, 58); she has hypoglycemia awareness at 60s.  Highest sugar was 305 >> 104 >> 130.  She has been off work 2/2 back pain. She does PT. She will have nrew injections at the end of this mo.  Pt's meals are: - Breakfast: yoghurt or bowl of grits/coffee/banana - Lunch + Dinner: grilled chicken salad - Snacks: veggies, bananas, oranges, crackers; no sodas She has lost 139 lbs - increased exercise and water intake.  - no CKD, last BUN/creatinine:  Lab Results  Component Value Date   BUN 9 04/12/2014   CREATININE 0.59 04/12/2014  She is not on an ACEI/ARB. - last set of lipids: Lab  Results  Component Value Date   CHOL 136 09/10/2014   HDL 40.90 09/10/2014   LDLCALC 67 09/10/2014   TRIG 141.0 09/10/2014   CHOLHDL 3 09/10/2014  She is not on a statin. - last eye exam was in 04/2014. No DR. Has cataracts.  - + numbness, but no tingling in her feet.  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  ROS: Constitutional: no weight gain, no increased appetite, no fatigue, no subjective hyperthermia/hypothermia Eyes: no blurry vision, no xerophthalmia ENT: + sore throat, no nodules palpated in throat, dysphagia/odynophagia, no hoarseness Cardiovascular: no CP/SOB/palpitations/leg swelling Respiratory: + cough/no SOB Gastrointestinal: no N/V/D/C Musculoskeletal: no muscle/joint aches, + back pain Skin: no rashes Neurological: no tremors/numbness/tingling/dizziness  I reviewed pt's medications, allergies, PMH, social hx, family hx, and changes were documented in the history of present illness. Otherwise, unchanged from my initial visit note. Changed from Flexeril to Tramadol.   PE: BP 118/66 mmHg  Pulse 94  Temp(Src) 98.1 F (36.7 C) (Oral)  Resp 12  Wt 223 lb (101.152 kg)  SpO2 98% Body mass index is 38.26 kg/(m^2).  Wt Readings from Last 3 Encounters:  12/10/14 223 lb (101.152 kg)  10/31/14 221 lb (100.245 kg)  09/10/14 217 lb (  98.431 kg)   Constitutional: overweight, in NAD Eyes: PERRLA, EOMI, no exophthalmos ENT: moist mucous membranes, no thyromegaly, no cervical lymphadenopathy Cardiovascular: RRR, No MRG Respiratory: CTA B Gastrointestinal: abdomen soft, NT, ND, BS+ Musculoskeletal: no deformities, strength intact in all 4 Skin: moist, warm, no rashes Neurological: no tremor with outstretched hands, DTR normal in all 4  ASSESSMENT: 1. DM2, non-insulin-dependent, uncontrolled, without complications  PLAN:  1. Patient with long-standing, recently well controlled diabetes, on oral antidiabetic  regimen - with Januvia and Metformin XR - she tolerates the metformin well. Sugars all at goal. - Will continue same regimen for now:  Patient Instructions  Please continue Metformin 1000 mg 2x a day. Continue Januvia 100 mg daily in am.  Please return in 3 months with your sugar log.   Please stop at the lab.  - continue checking sugars at different times of the day - check 2 times a day, rotating checks - advised for yearly eye exams >> she is up to date - check HbA1c - refilled Januvia and strips - I advised her to let me know if sugars are high after her steroid inj at the end of this mo >> we may need low dose Glipizide then - Return to clinic in 3 mo with sugar log   Office Visit on 12/10/2014  Component Date Value Ref Range Status  . Hgb A1c MFr Bld 12/10/2014 6.8* 4.6 - 6.5 % Final   Glycemic Control Guidelines for People with Diabetes:Non Diabetic:  <6%Goal of Therapy: <7%Additional Action Suggested:  >8%    Excellent hemoglobin A1c!

## 2015-01-07 ENCOUNTER — Encounter: Payer: Self-pay | Admitting: Neurology

## 2015-01-07 ENCOUNTER — Ambulatory Visit (INDEPENDENT_AMBULATORY_CARE_PROVIDER_SITE_OTHER): Payer: BLUE CROSS/BLUE SHIELD | Admitting: Neurology

## 2015-01-07 VITALS — BP 121/76 | HR 81 | Ht 64.0 in | Wt 219.0 lb

## 2015-01-07 DIAGNOSIS — M545 Low back pain, unspecified: Secondary | ICD-10-CM

## 2015-01-07 DIAGNOSIS — M25552 Pain in left hip: Secondary | ICD-10-CM

## 2015-01-07 NOTE — Progress Notes (Signed)
PATIENT: Kelly Simon DOB: [birth year]  HISTORICAL  Kelly Simon is a 48 yo RH AAF referred by neurosurgeon Dr. Mikal Plane, and primary care physician Dollitte for evaluation of low back pain, left leg discomfort.  She had a past medical history of diabetes, in August 22nd 2015, she was at work, she felt sharp pain in her back, radiating pain to her left hip, left leg, numbness wrapping around her left hip, to her left groin area, intermittent, but moderate to severe, actually presented to the emergency room,  She was treated with steroid package twice, September, again October 2015, only had temporary relief  She continued to have significant low back pain, radiating pain to her left hip, left lateral leg, left leg paresthesia,  Had extensive evaluation, and treatment,  I have reviewed MRI lumbar September 2015 at [facility] imaging: Small left foraminal disc protrusion at L4-5, closely approximating the left L4 nerve root as it courses out of the left neural foramen. This could potentially result in nerve root Irritation. Small extra foraminal/far lateral disc protrusion at L3-4, closely approximating the exiting left L3 nerve root. Otherwise unremarkable MRI of the lumbar spine.  CT myelogram: Mild L3-L4 disc degeneration and RIGHT facet arthrosis. No stenosis. L4-L5 disc degeneration with shallow broad-based posterior disc bulge eccentric to the LEFT. In the LEFT lateral region, this contacts the exited LEFT L4 nerve and could produce irritation. Mild RIGHT facet arthrosis at L4-L5.  She has tried physical therapy, different medications, narcotics, muscle relaxants, lumbar epidural injection, without helping her symptoms,  Reported normal EMG nerve conduction study from outside facility  UPDATE April 5th 2016: She had Left SI joint injection in December 25 2014 by Dr. Murray Hodgkins, responded very well. She no longer has left side low back pain. X-ray of left hip was normal.     REVIEW OF SYSTEMS: Full 14 system review of systems performed and notable only for as above  ALLERGIES: Allergies  Allergen Reactions  . Metformin And Related     diarrhea  . Adhesive [Tape] Rash    HOME MEDICATIONS: Current Outpatient Prescriptions  Medication Sig Dispense Refill  . albuterol (PROVENTIL HFA;VENTOLIN HFA) 108 (90 BASE) MCG/ACT inhaler Inhale 1-2 puffs into the lungs every 6 (six) hours as needed for wheezing or shortness of breath.    Marland Kitchen glucose blood (ONE TOUCH ULTRA TEST) test strip 1 each by Other route 3 (three) times daily. 300 each 1  . metFORMIN (GLUCOPHAGE-XR) 500 MG 24 hr tablet Take 2 tablets (1,000 mg total) by mouth 2 (two) times daily. 360 tablet 1  . sitaGLIPtin (JANUVIA) 100 MG tablet Take 1 tablet (100 mg total) by mouth daily. 90 tablet 1  . traMADol (ULTRAM) 50 MG tablet Take 50 mg by mouth 2 (two) times daily.   0   No current facility-administered medications for this visit.    PAST MEDICAL HISTORY: Past Medical History  Diagnosis Date  . Diabetes mellitus without complication   . Depression   . Low back pain   . Numbness and tingling of left leg     PAST SURGICAL HISTORY: Past Surgical History  Procedure Laterality Date  . Abdominal hysterectomy  2005    ADENOMYOSIS; ovaries resected.  . Cryoablation  2004  . Laparoscopic abdominal exploration    . Upper gastrointestinal endoscopy      FAMILY HISTORY: Family History  Problem Relation Age of Onset  . Cancer Mother     Cervical s/p hysterectomy; mets  to the abdomen  . Hypertension Mother   . Cancer Father     prostate cancer    SOCIAL HISTORY:  History   Social History  . Marital Status: Single    Spouse Name: Kelly Simon  . Number of Children: 3  . Years of Education: 12+   Occupational History  . CUSTOMER SERVICE SUPERVISOR    Social History Main Topics  . Smoking status: Former Smoker -- 26 years    Types: Cigarettes    Quit date: 04/03/2008  . Smokeless  tobacco: Never Used  . Alcohol Use: 0.0 oz/week    0 Standard drinks or equivalent per week     Comment: Occasionally  . Drug Use: No  . Sexual Activity:    Partners: Male    Birth Control/ Protection: Surgical   Other Topics Concern  . Not on file   Social History Narrative   Lives at home with husband.    Three sons are grown and live independently.    Right-handed.   2 cups caffeine/day.     PHYSICAL EXAM   Filed Vitals:   01/07/15 0932  BP: 121/76  Pulse: 81  Height: 5\' 4"  (1.626 m)  Weight: 219 lb (99.338 kg)    Not recorded      Body mass index is 37.57 kg/(m^2).  PHYSICAL EXAMNIATION:  Gen: NAD, conversant, well nourised, obese, well groomed                     Cardiovascular: Regular rate rhythm, no peripheral edema, warm, nontender. Eyes: Conjunctivae clear without exudates or hemorrhage Neck: Supple, no carotid bruise. Pulmonary: Clear to auscultation bilaterally  Musculoskeleton: wear a tight lumbar brace  NEUROLOGICAL EXAM:  MENTAL STATUS: Speech:    Speech is normal; fluent and spontaneous with normal comprehension.  Cognition:    The patient is oriented to person, place, and time;     recent and remote memory intact;     language fluent;     normal attention, concentration,     fund of knowledge.  CRANIAL NERVES: CN II: Visual fields are full to confrontation. Fundoscopic exam is normal with sharp discs and no vascular changes. Venous pulsations are present bilaterally. Pupils are 4 mm and briskly reactive to light. Visual acuity is 20/20 bilaterally. CN III, IV, VI: extraocular movement are normal. No ptosis. CN V: Facial sensation is intact to pinprick in all 3 divisions bilaterally. Corneal responses are intact.  CN VII: Face is symmetric with normal eye closure and smile. CN VIII: Hearing is normal to rubbing fingers CN IX, X: Palate elevates symmetrically. Phonation is normal. CN XI: Head turning and shoulder shrug are intact CN XII:  Tongue is midline with normal movements and no atrophy.  MOTOR: There is no pronator drift of out-stretched arms. Muscle bulk and tone are normal. Muscle strength is normal.   Shoulder abduction Shoulder external rotation Elbow flexion Elbow extension Wrist flexion Wrist extension Finger abduction Hip flexion Knee flexion Knee extension Ankle dorsi flexion Ankle plantar flexion  R 5 5 5 5 5 5 5 5 5 5 5 5   L 5 5 5 5 5 5 5 5 5 5 5 5     REFLEXES: Reflexes are 2+ and symmetric at the biceps, triceps, knees, and ankles. Plantar responses are flexor.  SENSORY: Light touch, pinprick, position sense, and vibration sense are intact in fingers and toes.  COORDINATION: Rapid alternating movements and fine finger movements are intact. There is no dysmetria  on finger-to-nose and heel-knee-shin. There are no abnormal or extraneous movements.   GAIT/STANCE: Antalgic gait, cautious Romberg is absent.  DIAGNOSTIC DATA (LABS, IMAGING, TESTING) - I reviewed patient records, labs, notes, testing and imaging myself where available.  Lab Results  Component Value Date   WBC 11.8* 05/30/2014   HGB 13.7 05/30/2014   HCT 44.9 05/30/2014   MCV 87.8 05/30/2014   PLT 298 04/12/2014      Component Value Date/Time   NA 141 04/12/2014 1330   K 3.9 04/12/2014 1330   CL 100 04/12/2014 1330   CO2 21 04/12/2014 1330   GLUCOSE 218* 04/12/2014 1330   BUN 9 04/12/2014 1330   CREATININE 0.59 04/12/2014 1330   CREATININE 0.79 04/12/2014 1123   CALCIUM 9.6 04/12/2014 1330   PROT 7.6 04/12/2014 1330   ALBUMIN 4.0 04/12/2014 1330   AST 19 04/12/2014 1330   ALT 17 04/12/2014 1330   ALKPHOS 130* 04/12/2014 1330   BILITOT 0.3 04/12/2014 1330   GFRNONAA >90 04/12/2014 1330   GFRAA >90 04/12/2014 1330   Lab Results  Component Value Date   CHOL 136 09/10/2014   HDL 40.90 09/10/2014   LDLCALC 67 09/10/2014   TRIG 141.0 09/10/2014   CHOLHDL 3 09/10/2014   Lab Results  Component Value Date   HGBA1C 6.8*  12/10/2014   No results found for: [id number] Lab Results  Component Value Date   TSH 1.174 05/02/2013    ASSESSMENT AND PLAN  Kelly Simon is a 49 y.o. female  Presented with acute onset of left-sided low back pain, left hip pain, since August 2015, failed physical therapy, medication treatment, lumbar epidural injection, now has much improved after left SI joint injection, most consistent with a left SI pathology, continue hot compression, when necessary NSAIDs, Return to clinic as needed      Levert Feinstein, M.D. Ph.D.  [facility] [address] [place], [place] [address] Phone: [phone] Fax:      [phone] Levert Feinstein, M.D. Ph.D.  [facility] [address] [place], [place] [address] [phone]

## 2015-03-12 ENCOUNTER — Ambulatory Visit: Payer: BLUE CROSS/BLUE SHIELD | Admitting: Internal Medicine

## 2015-03-12 DIAGNOSIS — Z0289 Encounter for other administrative examinations: Secondary | ICD-10-CM

## 2015-04-12 LAB — HM DIABETES EYE EXAM

## 2015-09-01 ENCOUNTER — Encounter: Payer: Self-pay | Admitting: Internal Medicine

## 2015-09-29 ENCOUNTER — Other Ambulatory Visit: Payer: Self-pay | Admitting: Internal Medicine

## 2015-12-05 LAB — HM DIABETES EYE EXAM

## 2016-02-25 ENCOUNTER — Other Ambulatory Visit (INDEPENDENT_AMBULATORY_CARE_PROVIDER_SITE_OTHER): Payer: BLUE CROSS/BLUE SHIELD | Admitting: *Deleted

## 2016-02-25 ENCOUNTER — Ambulatory Visit (INDEPENDENT_AMBULATORY_CARE_PROVIDER_SITE_OTHER): Payer: BLUE CROSS/BLUE SHIELD | Admitting: Internal Medicine

## 2016-02-25 ENCOUNTER — Encounter: Payer: Self-pay | Admitting: Internal Medicine

## 2016-02-25 VITALS — BP 122/70 | HR 89 | Temp 97.9°F | Resp 12 | Wt 199.0 lb

## 2016-02-25 DIAGNOSIS — IMO0001 Reserved for inherently not codable concepts without codable children: Secondary | ICD-10-CM

## 2016-02-25 DIAGNOSIS — E1165 Type 2 diabetes mellitus with hyperglycemia: Secondary | ICD-10-CM | POA: Diagnosis not present

## 2016-02-25 LAB — POCT GLYCOSYLATED HEMOGLOBIN (HGB A1C)

## 2016-02-25 MED ORDER — BASAGLAR KWIKPEN 100 UNIT/ML ~~LOC~~ SOPN: 20.0000 [IU] | PEN_INJECTOR | Freq: Every day | SUBCUTANEOUS | Status: DC

## 2016-02-25 MED ORDER — METFORMIN HCL ER 500 MG PO TB24: 1000.0000 mg | ORAL_TABLET | Freq: Two times a day (BID) | ORAL | Status: DC

## 2016-02-25 MED ORDER — GLIPIZIDE ER 5 MG PO TB24: 5.0000 mg | ORAL_TABLET | Freq: Every day | ORAL | Status: DC

## 2016-02-25 MED ORDER — SITAGLIPTIN PHOSPHATE 100 MG PO TABS: 100.0000 mg | ORAL_TABLET | Freq: Every day | ORAL | Status: DC

## 2016-02-25 MED ORDER — INSULIN PEN NEEDLE 32G X 4 MM MISC: Status: DC

## 2016-02-25 NOTE — Patient Instructions (Signed)
Please continue: - Januvia 100 mg in am - Metformin XR 1000 mg 2x a day  Please start: - Basaglar 20 units at bedtime.  In 3 days, if sugars in am are not <150, then increase by 5 units.  In 3 days, if sugars in am are not <150, then increase by another 5 units.  Please add Glipizide ER 5 mg in am, before b'fast. If you have no lows and sugars later in the day are still high, increase the dose to 10 mg in am.  Please return in 1.5 months with your sugar log.

## 2016-02-25 NOTE — Progress Notes (Signed)
Patient ID: Kelly Simon, female   DOB: 08-03-1966, 50 y.o.   MRN: [id number]  HPI: Kelly Simon is a 50 y.o.-year-old female, returning for f/u for DM2, dx 2006, now insulin-dependent, uncontrolled, without complications. Last visit 14 mo ago.  Sugars much higher than last visit, but w/o increased thirst, urination, fatigue, HAs. She had multiple family pbs since last visit, but no clear cause for the increase...  Last hemoglobin A1c was: Lab Results  Component Value Date   HGBA1C 6.8* 12/10/2014   HGBA1C 7.2* 09/10/2014   HGBA1C >14.0 04/12/2014  She was on steroids for back pain in 09-08/2014: Prednisone taper pack, then injections in back. Sugars spiked then. She was taken off her diabetes meds in 2014 as her A1c was great >> sugars got out of control (in the 400s). She was on insulin before.  Pt is on a regimen of: - Januvia 100 mg in am - Metformin XR 1000 mg bid - added 04/2014 >> some diarrhea  She stopped Glipizide 5 mg bid b/c of low CBGs. She had problems with Metformin - tried this in 2011: abdominal cramps and diarrhea.   Pt checks her sugars 2-3x a day and they are (per meter download): - am: 250-268 >> 84-98 >> 80s >> 92-94 >> 327-417 - 2.5 h after b'fast: 250-260 >> n/c >> 333-530 - before lunch: n/c >> 93-104 >> 92-96, if more active: 80s >> 98-103 >> 315-375 - 2h after lunch: 260s >> n/c >> 442 - before dinner: 260s >> 79-97 >> 88-90s >> n/c >> 340-HIGH - 2h after dinner: n/c >> n/c >> 115-118 - bedtime: n/c >> 78-90 >> n/c She had lows before stopping Glipizide (34, 58) not lately; she has hypoglycemia awareness at 60s.  Highest sugar was 305 >> 104 >> 130 >> HI.  She has been off work 2/2 back pain. She does PT. She will have nrew injections at the end of this mo.  Pt's meals are: - Breakfast: yoghurt or bowl of grits/coffee/banana - Lunch + Dinner: grilled chicken salad - Snacks: veggies, bananas, oranges, crackers; no sodas She has lost 139  lbs - increased exercise and water intake.  - no CKD, last BUN/creatinine:  Lab Results  Component Value Date   BUN 9 04/12/2014   CREATININE 0.59 04/12/2014  She is not on an ACEI/ARB. - last set of lipids: Lab Results  Component Value Date   CHOL 136 09/10/2014   HDL 40.90 09/10/2014   LDLCALC 67 09/10/2014   TRIG 141.0 09/10/2014   CHOLHDL 3 09/10/2014  She is not on a statin. - last eye exam was:  Scanned Document on 01/09/2016  Component Date Value Ref Range Comments  . HM Diabetic Eye Exam 12/05/2015 No Retinopathy  No Retinopathy Has cataracts.    - + numbness, but no tingling in her feet.  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  ROS: Constitutional: no weight gain, no increased appetite, no fatigue, no subjective hyperthermia/hypothermia Eyes: no blurry vision, no xerophthalmia ENT: + sore throat, no nodules palpated in throat, dysphagia/odynophagia, no hoarseness Cardiovascular: no CP/SOB/palpitations/leg swelling Respiratory: + cough/no SOB Gastrointestinal: no N/V/D/C Musculoskeletal: no muscle/joint aches, no back pain Skin: no rashes Neurological: no tremors/numbness/tingling/dizziness  I reviewed pt's medications, allergies, PMH, social hx, family hx, and changes were documented in the history of present illness. Otherwise, unchanged from my initial visit note. Changed from Flexeril to Tramadol.  PE: BP 122/70 mmHg  Pulse 89  Temp(Src) 97.9 F (36.6 C) (Oral)  Resp 12  Wt 199 lb (90.266 kg)  SpO2 95% Body mass index is 34.14 kg/(m^2).  Wt Readings from Last 3 Encounters:  02/25/16 199 lb (90.266 kg)  01/07/15 219 lb (99.338 kg)  12/10/14 223 lb (101.152 kg)   Constitutional: overweight, in NAD Eyes: PERRLA, EOMI, no exophthalmos ENT: moist mucous membranes, no thyromegaly, no cervical lymphadenopathy Cardiovascular: RRR, No MRG Respiratory: CTA B Gastrointestinal: abdomen soft, NT,  ND, BS+ Musculoskeletal: no deformities, strength intact in all 4 Skin: moist, warm, no rashes Neurological: no tremor with outstretched hands, DTR normal in all 4  ASSESSMENT: 1. DM2, insulin-dependent, uncontrolled, without complications  PLAN:  1. Patient with long-standing, well controlled diabetes in the past - now much worse. She is on oral antidiabetic regimen - with Januvia and Metformin XR, but needs insulin. She agrees  -will start basal insulin. Will also add Glipizide ER to help with CBGs after meals. - discussed correct inj techniques - I advised her to:  Patient Instructions  Please continue: - Januvia 100 mg in am - Metformin XR 1000 mg 2x a day  Please start: - Basaglar 20 units at bedtime.  In 3 days, if sugars in am are not <150, then increase by 5 units.  In 3 days, if sugars in am are not <150, then increase by another 5 units.  Please add Glipizide ER 5 mg in am, before b'fast. If you have no lows and sugars later in the day are still high, increase the dose to 10 mg in am.  Please return in 1.5 months with your sugar log.   - check sugars at different times of the day - check 2-3 times a day, rotating checks - advised for yearly eye exams >> she is up to date - check HbA1c today >> >15% (tremendous increase!!!!) - refilled meds - Return to clinic in 1.5 mo with sugar log

## 2016-03-06 ENCOUNTER — Telehealth: Payer: Self-pay | Admitting: Family Medicine

## 2016-03-06 NOTE — Telephone Encounter (Signed)
On call note.  Now with sugars down to 75 in the AM.  Advised to eat lunch, supper and PM snack.  Stop glipzide and cut insulin back to 20 units, had prev increased up to 25 units qhs.  Routed to [facility] as FYI.  Please contact patient on Monday.

## 2016-03-08 NOTE — Telephone Encounter (Signed)
Oh, excellent!

## 2016-03-08 NOTE — Telephone Encounter (Signed)
Carollee HerterShannon, Can you please check on her? I agree with the changes by Dr Para Marchuncan, but let's see how she is doing.

## 2016-03-08 NOTE — Telephone Encounter (Signed)
Called pt and she said she is doing better. Her blood sugars are between 100-118. Be advised.

## 2016-03-13 LAB — HM DIABETES EYE EXAM

## 2016-03-19 ENCOUNTER — Other Ambulatory Visit: Payer: Self-pay | Admitting: Internal Medicine

## 2016-03-24 IMAGING — CR DG HIP (WITH OR WITHOUT PELVIS) 2-3V*L*
3 series · 3 of 3 positions shown · non-contrast
Comparison: None.

CLINICAL DATA: Chronic left hip pain, fell 1 month ago, decreased
range of motion

EXAM:
LEFT HIP (WITH PELVIS) 2-3 VIEWS

[view not recorded (1 of 3)]
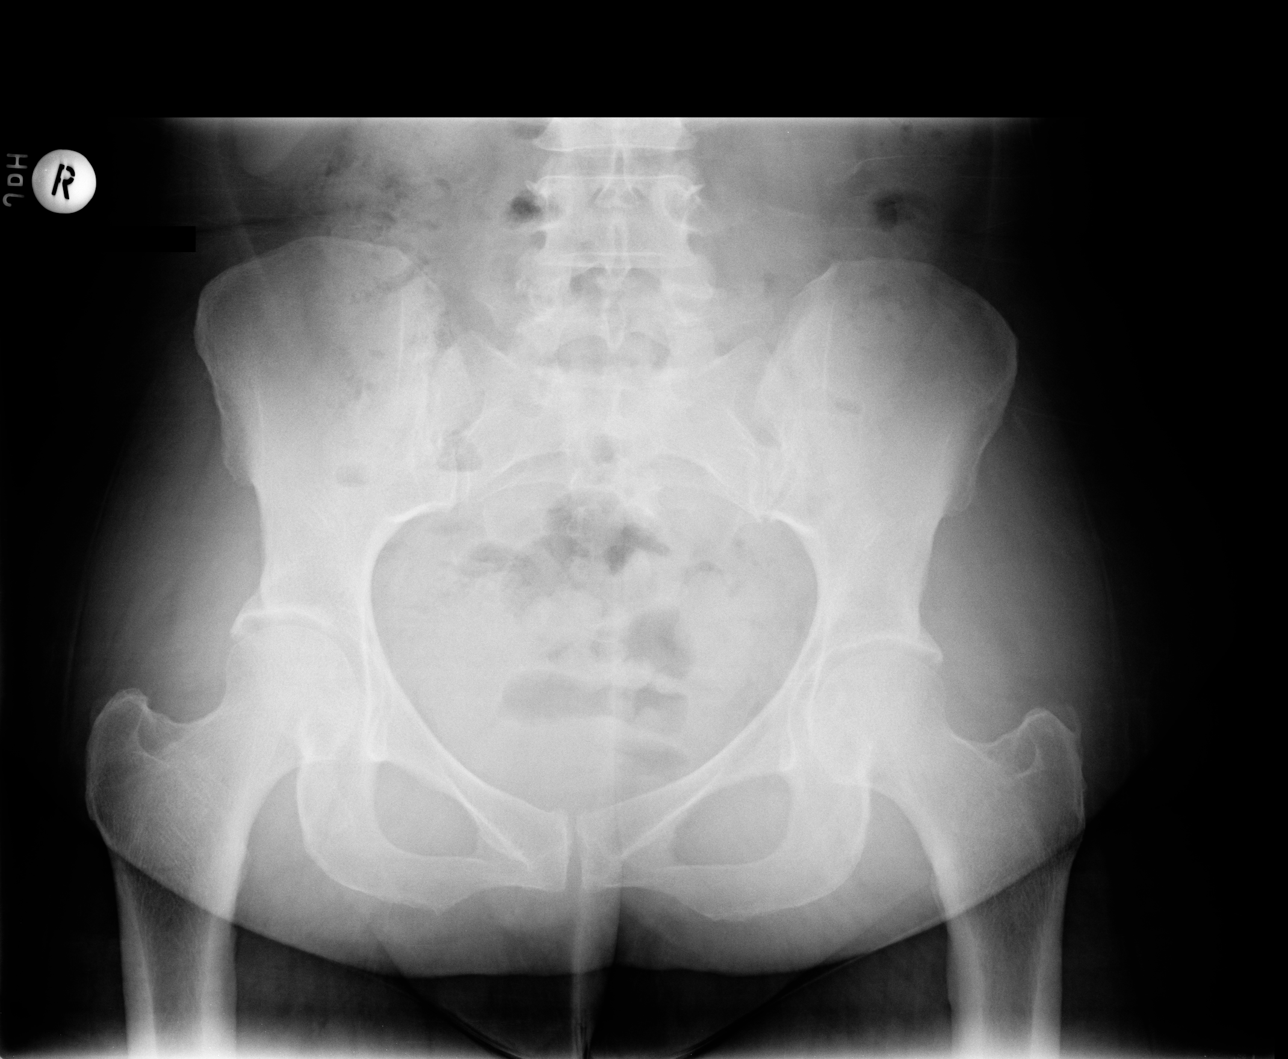

[view not recorded (2 of 3)]
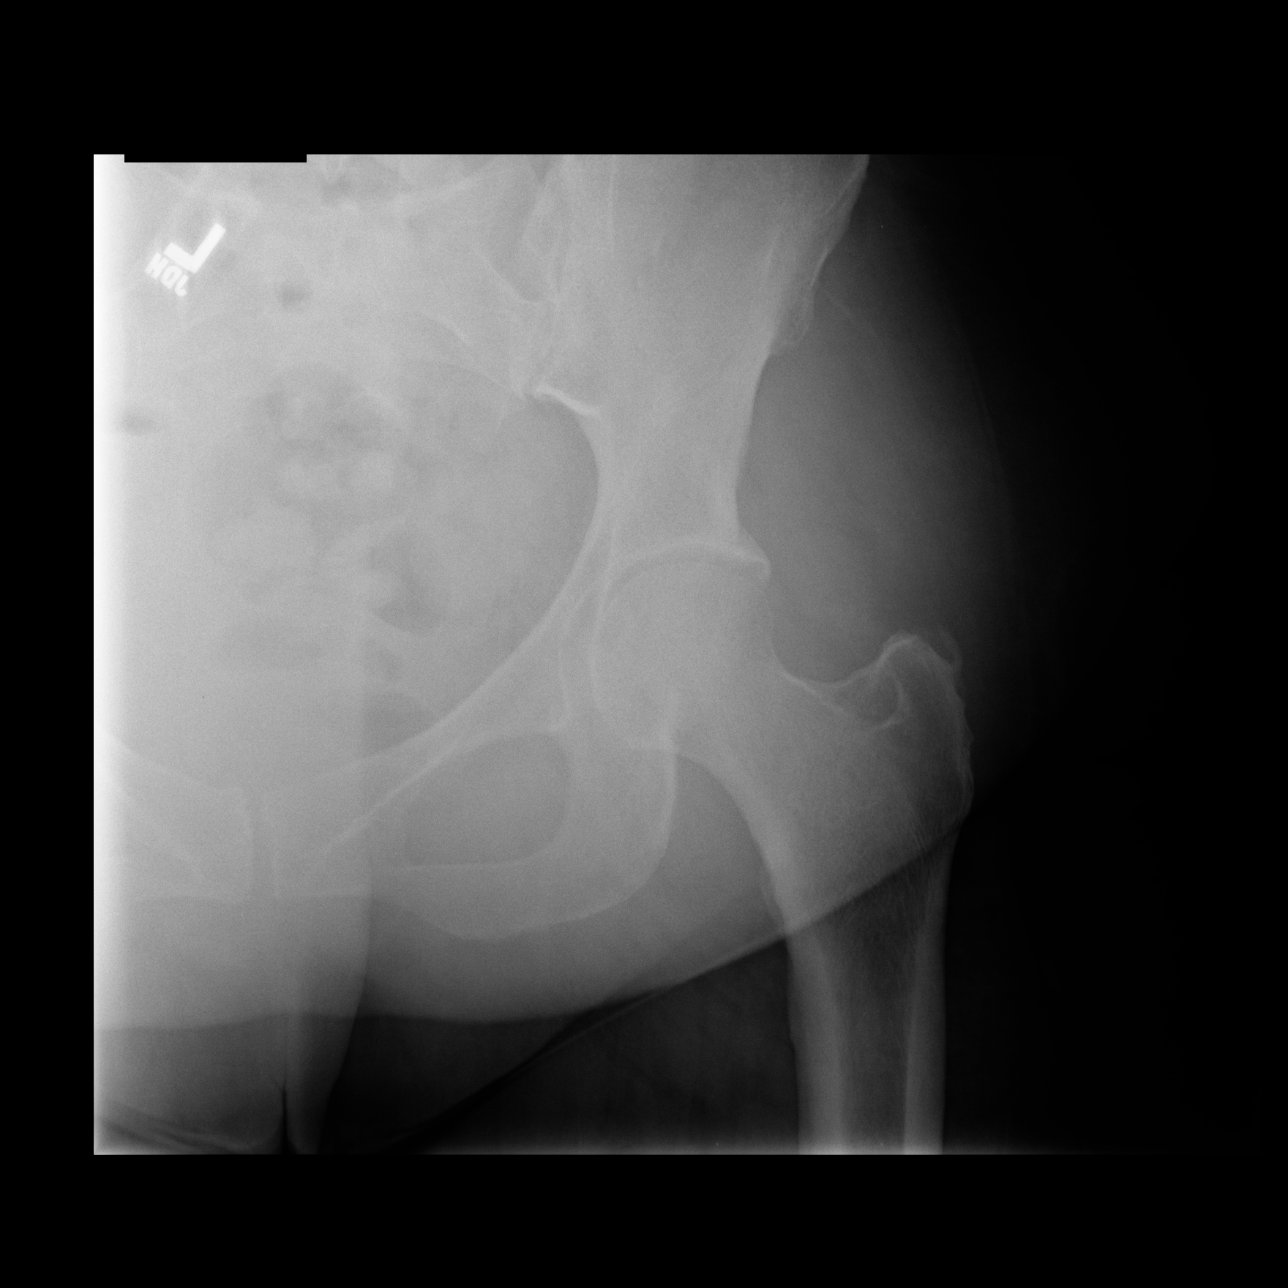

[view not recorded (3 of 3)]
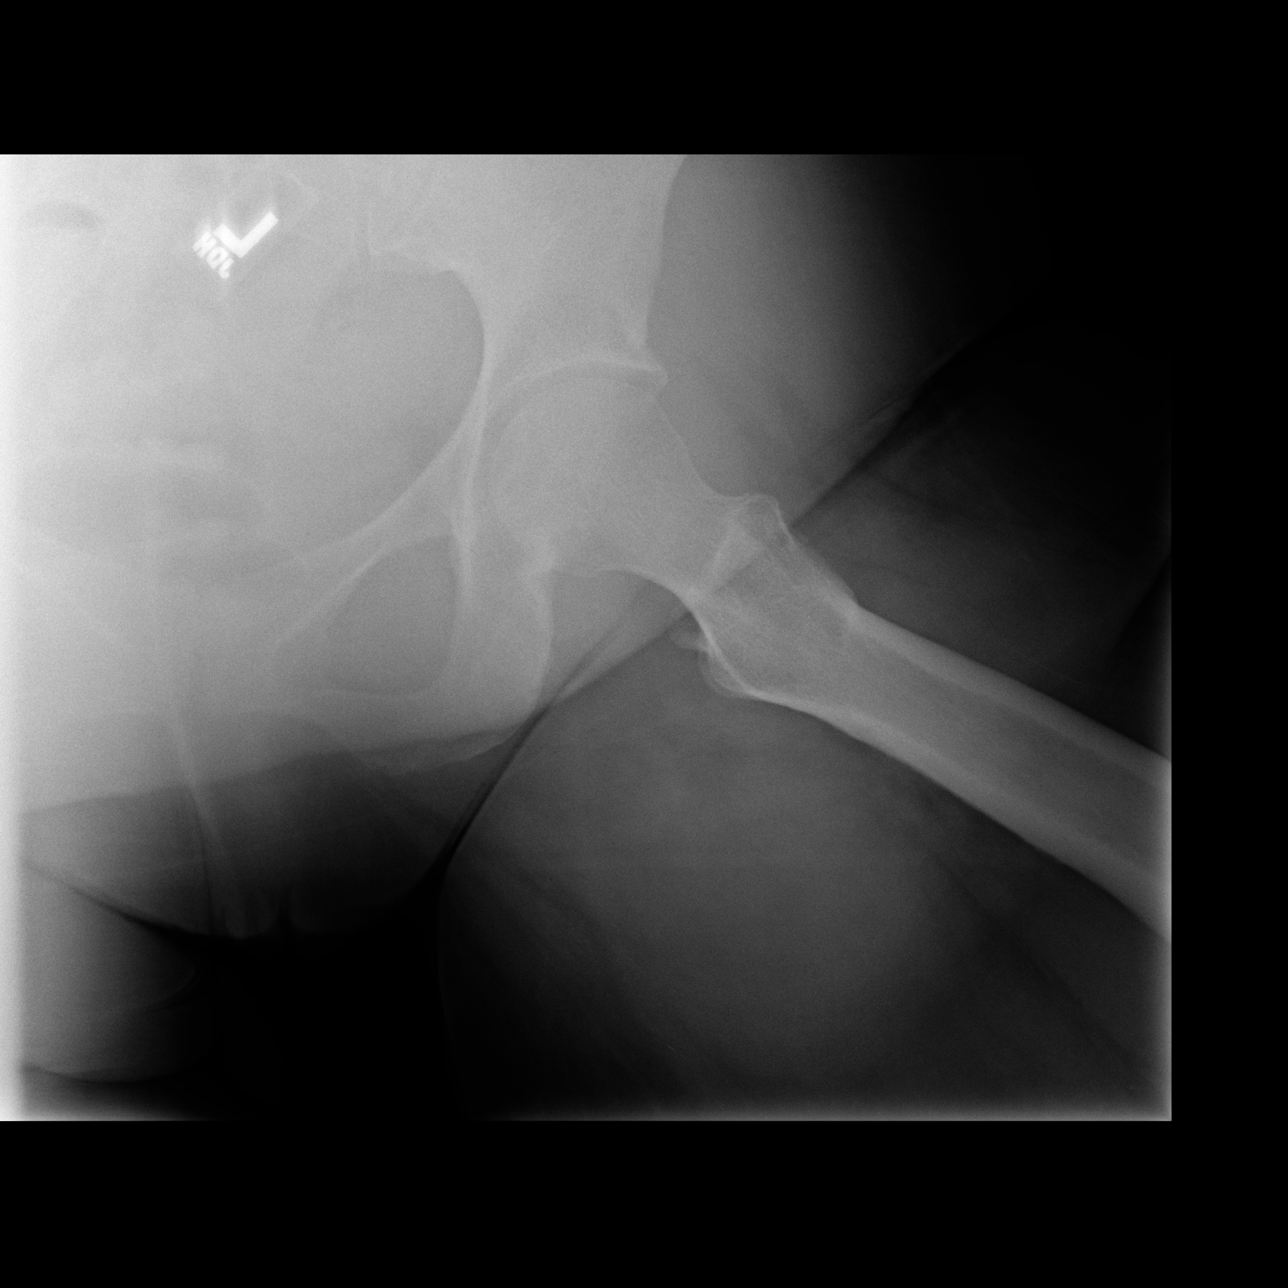

[3 of 3 positions shown; findings below may reference images not displayed]

FINDINGS: The hip joint spaces appear normal. No fracture is seen. The pelvic
rami are intact. The SI joints are unremarkable.
IMPRESSION: Negative.

## 2016-04-22 ENCOUNTER — Ambulatory Visit (INDEPENDENT_AMBULATORY_CARE_PROVIDER_SITE_OTHER): Payer: BLUE CROSS/BLUE SHIELD | Admitting: Internal Medicine

## 2016-04-22 ENCOUNTER — Encounter: Payer: Self-pay | Admitting: Internal Medicine

## 2016-04-22 VITALS — BP 124/88 | HR 92 | Ht 64.0 in | Wt 213.0 lb

## 2016-04-22 DIAGNOSIS — E1165 Type 2 diabetes mellitus with hyperglycemia: Secondary | ICD-10-CM | POA: Diagnosis not present

## 2016-04-22 DIAGNOSIS — IMO0001 Reserved for inherently not codable concepts without codable children: Secondary | ICD-10-CM

## 2016-04-22 LAB — COMPLETE METABOLIC PANEL WITH GFR
ALBUMIN: 3.7 g/dL (ref 3.6–5.1)
ALK PHOS: 74 U/L (ref 33–115)
ALT: 11 U/L (ref 6–29)
AST: 14 U/L (ref 10–35)
BUN: 10 mg/dL (ref 7–25)
CALCIUM: 9.2 mg/dL (ref 8.6–10.2)
CO2: 26 mmol/L (ref 20–31)
Chloride: 108 mmol/L (ref 98–110)
Creat: 0.83 mg/dL (ref 0.50–1.10)
GFR, EST NON AFRICAN AMERICAN: 83 mL/min (ref >=60)
Glucose, Bld: 101 mg/dL — ABNORMAL HIGH (ref 65–99)
POTASSIUM: 4.7 mmol/L (ref 3.5–5.3)
SODIUM: 142 mmol/L (ref 135–146)
Total Bilirubin: 0.3 mg/dL (ref 0.2–1.2)
Total Protein: 6.4 g/dL (ref 6.1–8.1)

## 2016-04-22 LAB — MICROALBUMIN / CREATININE URINE RATIO
CREATININE, U: 181 mg/dL
MICROALB/CREAT RATIO: 0.4 mg/g (ref 0.0–30.0)

## 2016-04-22 LAB — LIPID PANEL
Cholesterol: 117 mg/dL (ref 0–200)
HDL: 38.6 mg/dL — AB (ref >39.00)
LDL Cholesterol: 63 mg/dL (ref 0–99)
NONHDL: 78.4
Total CHOL/HDL Ratio: 3
Triglycerides: 78 mg/dL (ref 0.0–149.0)
VLDL: 15.6 mg/dL (ref 0.0–40.0)

## 2016-04-22 MED ORDER — BASAGLAR KWIKPEN 100 UNIT/ML ~~LOC~~ SOPN: 15.0000 [IU] | PEN_INJECTOR | Freq: Every day | SUBCUTANEOUS | Status: DC

## 2016-04-22 NOTE — Patient Instructions (Signed)
Please decrease Basaglar to 15 units at bedtime.  Continue: - Januvia 100 mg in am - Metformin XR 1000 mg 2x a day  Please return in 1.5 months with your sugar log.

## 2016-04-22 NOTE — Progress Notes (Signed)
Patient ID: Kelly Simon, female   DOB: 1966/06/25, 50 y.o.   MRN: [id number]  HPI: Kelly Simon is a 50 y.o.-year-old female, returning for f/u for DM2, dx 2006, now insulin-dependent, uncontrolled, without complications. Last visit 2 mo ago.  Sugars were much higher at last visit. She had multiple family pbs before this. We added basal insulin >> sugars improved dramatically.  Last hemoglobin A1c was: Lab Results  Component Value Date   HGBA1C >15 02/25/2016   HGBA1C 6.8* 12/10/2014   HGBA1C 7.2* 09/10/2014  She was on steroids for back pain in 09-08/2014: Prednisone taper pack, then injections in back. Sugars spiked then. She was taken off her diabetes meds in 2014 as her A1c was great >> sugars got out of control (in the 400s). She was on insulin before.  Pt is on a regimen of: - Januvia 100 mg in am - Metformin XR 1000 mg 2x a day - added 02/2016 - Basaglar 20 units at bedtime - added 02/2016 She stopped Glipizide  And Glipizide er5 mg bid b/c of low CBGs. She had problems with Metformin - tried this in 2011: abdominal cramps and diarrhea.   Pt checks her sugars 2-3x a day and they are (per meter download): - am: 250-268 >> 84-98 >> 80s >> 92-94 >> 327-417 >> 85-90s - 2.5 h after b'fast: 250-260 >> n/c >> 333-530 >> <100, 160 - before lunch: n/c >> 93-104 >> 92-96, if more active: 80s >> 98-103 >> 315-375 >> 80-90s - 2h after lunch: 260s >> n/c >> 442 >> <100 - before dinner: 260s >> 79-97 >> 88-90s >> n/c >> 340-HIGH >> 100 - 2h after dinner: n/c >> n/c >> 115-118 >> n/c - bedtime: n/c >> 78-90 >> n/c She had lows before stopping Glipizide (34, 58) >> 52 (stopped Glipizide since then), 79; she has hypoglycemia awareness at 60s.  Highest sugar was 305 >> 104 >> 130 >> HI >> 160.  She has been off work 2/2 back pain. She does PT. She will have nrew injections at the end of this mo.  Pt's meals are: - Breakfast: yoghurt or bowl of grits/coffee/banana - Lunch  + Dinner: grilled chicken salad - Snacks: veggies, bananas, oranges, crackers; no sodas She has lost 139 lbs - increased exercise and water intake.  - no CKD, last BUN/creatinine:  Lab Results  Component Value Date   BUN 9 04/12/2014   CREATININE 0.59 04/12/2014  She is not on an ACEI/ARB. - last set of lipids: Lab Results  Component Value Date   CHOL 136 09/10/2014   HDL 40.90 09/10/2014   LDLCALC 67 09/10/2014   TRIG 141.0 09/10/2014   CHOLHDL 3 09/10/2014  She is not on a statin. - last eye exam was:  Scanned Document on 01/09/2016  Component Date Value Ref Range Comments  . HM Diabetic Eye Exam 12/05/2015 No Retinopathy  No Retinopathy Has cataracts.    - + numbness, but no tingling in her feet.  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  ROS: Constitutional: + weight gain, no increased appetite, no fatigue, no subjective hyperthermia/hypothermia Eyes: no blurry vision, no xerophthalmia ENT: no sore throat, no nodules palpated in throat, dysphagia/odynophagia, no hoarseness Cardiovascular: no CP/SOB/palpitations/leg swelling Respiratory: no cough/no SOB Gastrointestinal: no N/V/D/C Musculoskeletal: no muscle/joint aches, no back pain Skin: no rashes Neurological: no tremors/numbness/tingling/dizziness  I reviewed pt's medications, allergies, PMH, social hx,  family hx, and changes were documented in the history of present illness. Otherwise, unchanged from my initial visit note. Changed from Flexeril to Tramadol.   PE: BP 124/88 mmHg  Pulse 92  Ht 5\' 4"  (1.626 m)  Wt 213 lb (96.616 kg)  BMI 36.54 kg/m2  SpO2 96% Body mass index is 36.54 kg/(m^2).  Wt Readings from Last 3 Encounters:  04/22/16 213 lb (96.616 kg)  02/25/16 199 lb (90.266 kg)  01/07/15 219 lb (99.338 kg)   Constitutional: obese, in NAD Eyes: PERRLA, EOMI, no exophthalmos ENT: moist mucous membranes, no thyromegaly, no cervical  lymphadenopathy Cardiovascular: RRR, No MRG Respiratory: CTA B Gastrointestinal: abdomen soft, NT, ND, BS+ Musculoskeletal: no deformities, strength intact in all 4 Skin: moist, warm, no rashes Neurological: no tremor with outstretched hands, DTR normal in all 4  ASSESSMENT: 1. DM2, insulin-dependent, uncontrolled, without complications  PLAN:  1. Patient with long-standing DM with much worse control at last visit (HbA1c >15%). We started basal insulin and Glipizide ER >> sugars improved dramatically >> she came off Glipizide and decrease Basaglar. As sugars are great >> decrease Basglar even more today. - I advised her to:  Patient Instructions  Please decrease Basaglar to 15 units at bedtime.  Continue: - Januvia 100 mg in am - Metformin XR 1000 mg 2x a day  Please return in 1.5 months with your sugar log.   - check sugars at different times of the day - check 2-3 times a day, rotating checks - advised for yearly eye exams >> she is up to date - Return to clinic in 1.5 mo with sugar log   Carlus Pavlovristina Kareli Hossain, MD PhD [facility]

## 2016-06-14 ENCOUNTER — Encounter: Payer: Self-pay | Admitting: Internal Medicine

## 2016-06-14 ENCOUNTER — Ambulatory Visit (INDEPENDENT_AMBULATORY_CARE_PROVIDER_SITE_OTHER): Payer: BLUE CROSS/BLUE SHIELD | Admitting: Internal Medicine

## 2016-06-14 VITALS — BP 124/78 | HR 96 | Ht 63.5 in | Wt 218.0 lb

## 2016-06-14 DIAGNOSIS — IMO0001 Reserved for inherently not codable concepts without codable children: Secondary | ICD-10-CM

## 2016-06-14 DIAGNOSIS — E1165 Type 2 diabetes mellitus with hyperglycemia: Secondary | ICD-10-CM | POA: Diagnosis not present

## 2016-06-14 DIAGNOSIS — Z794 Long term (current) use of insulin: Secondary | ICD-10-CM | POA: Diagnosis not present

## 2016-06-14 LAB — POCT GLYCOSYLATED HEMOGLOBIN (HGB A1C): HEMOGLOBIN A1C: 6.4

## 2016-06-14 MED ORDER — SITAGLIPTIN PHOSPHATE 100 MG PO TABS: ORAL_TABLET | ORAL | 3 refills | Status: DC

## 2016-06-14 MED ORDER — BASAGLAR KWIKPEN 100 UNIT/ML ~~LOC~~ SOPN: 15.0000 [IU] | PEN_INJECTOR | Freq: Every day | SUBCUTANEOUS | 5 refills | Status: DC

## 2016-06-14 MED ORDER — INSULIN PEN NEEDLE 32G X 4 MM MISC: 1 refills | Status: DC

## 2016-06-14 MED ORDER — METFORMIN HCL ER 500 MG PO TB24: 1000.0000 mg | ORAL_TABLET | Freq: Two times a day (BID) | ORAL | 3 refills | Status: DC

## 2016-06-14 NOTE — Progress Notes (Addendum)
Patient ID: Kelly Simon, female   DOB: 1966/03/23, 50 y.o.   MRN: [id number]  HPI: Kelly Simon is a 50 y.o.-year-old female, returning for f/u for DM2, dx 2006, now insulin-dependent, uncontrolled, without complications. Last visit 1.5 mo ago.  Last hemoglobin A1c was: Lab Results  Component Value Date   HGBA1C >15 02/25/2016   HGBA1C 6.8 (H) 12/10/2014   HGBA1C 7.2 (H) 09/10/2014  She was on steroids for back pain in 09-08/2014: Prednisone taper pack, then injections in back. Sugars spiked then. She was taken off her diabetes meds in 2014 as her A1c was great >> sugars got out of control (in the 400s). She was on insulin before. We had to restart in 02/2016.  Pt is on a regimen of: - Januvia 100 mg in am - Metformin XR 1000 mg 2x a day - added 02/2016 - Basaglar 20 >> 15 units at bedtime - added 02/2016 She stopped Glipizide  And Glipizide 5 mg bid b/c of low CBGs. She had problems with Metformin - tried this in 2011: abdominal cramps and diarrhea.   Pt checks her sugars 2-3x a day and they are (per meter download): - am: 250-268 >> 84-98 >> 80s >> 92-94 >> 327-417 >> 85-90s >> 77, 96-116, 124, 132 - 2.5 h after b'fast: 250-260 >> n/c >> 333-530 >> <100, 160 >> 89, 90 - before lunch:93-104 >> 92-96, if more active: 80s >> 98-103 >> 315-375 >> 80-90s >> 93 - 2h after lunch: 260s >> n/c >> 442 >> <100 >> n/c - before dinner: 260s >> 79-97 >> 88-90s >> n/c >> 340-HIGH >> 100 >> 84-117, 131 - 2h after dinner: n/c >> n/c >> 115-118 >> n/c - bedtime: n/c >> 78-90 >> n/c She had lows before stopping Glipizide (34, 58) >> 52 (stopped Glipizide since then), 79 >> 76 >> 77; she has hypoglycemia awareness at 60s.  Highest sugar was 305 >> 104 >> 130 >> HI >> 160 >> 122 >> 132.  Pt's meals are: - Breakfast: yoghurt or bowl of grits/coffee/banana - Lunch + Dinner: grilled chicken salad - Snacks: veggies, bananas, oranges, crackers; no sodas She has lost 139 lbs - increased  exercise and water intake. Now gaining back on insulin.  - no CKD, last BUN/creatinine:  Lab Results  Component Value Date   BUN 10 04/22/2016   CREATININE 0.83 04/22/2016  She is not on an ACEI/ARB. - last set of lipids: Lab Results  Component Value Date   CHOL 117 04/22/2016   HDL 38.60 (L) 04/22/2016   LDLCALC 63 04/22/2016   TRIG 78.0 04/22/2016   CHOLHDL 3 04/22/2016  She is not on a statin. - last eye exam was:  Scanned Document on 01/09/2016  Component Date Value Ref Range Comments  . HM Diabetic Eye Exam 12/05/2015 No Retinopathy  No Retinopathy Has cataracts.    - + numbness, but no tingling in her feet.  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  ROS: Constitutional: + weight gain, no increased appetite, no fatigue, no subjective hyperthermia/hypothermia Eyes: no blurry vision, no xerophthalmia ENT: no sore throat, no nodules palpated in throat, dysphagia/odynophagia, no hoarseness Cardiovascular: no CP/SOB/palpitations/leg swelling Respiratory: no cough/no SOB Gastrointestinal: no N/V/D/C Musculoskeletal: no muscle/joint aches, no back pain Skin: no rashes Neurological: no tremors/numbness/tingling/dizziness  I reviewed pt's medications, allergies, PMH, social hx, family hx, and changes were documented in the history of  present illness. Otherwise, unchanged from my initial visit note. Changed from Flexeril to Tramadol.   PE: BP 124/78 (BP Location: Left Arm, Patient Position: Sitting)   Pulse 96   Ht 5' 3.5" (1.613 m)   Wt 218 lb (98.9 kg)   SpO2 95%   BMI 38.01 kg/m  Body mass index is 38.01 kg/m.  Wt Readings from Last 3 Encounters:  06/14/16 218 lb (98.9 kg)  04/22/16 213 lb (96.6 kg)  02/25/16 199 lb (90.3 kg)   Constitutional: obese, in NAD Eyes: PERRLA, EOMI, no exophthalmos ENT: moist mucous membranes, no thyromegaly, no cervical lymphadenopathy Cardiovascular: RRR, No  MRG Respiratory: CTA B Gastrointestinal: abdomen soft, NT, ND, BS+ Musculoskeletal: no deformities, strength intact in all 4 Skin: moist, warm, no rashes Neurological: no tremor with outstretched hands, DTR normal in all 4  ASSESSMENT: 1. DM2, insulin-dependent, uncontrolled, without complications  PLAN:  1. Patient with long-standing DM with dramatically improved control after we started insulin and glipizide >> she came off Glipizide and we were able to  decrease Basaglar at last 2 visits. Her sugars are great now. - I would be interested if we can potentially stop her insulin in the future >> will check her for DM1: Orders Placed This Encounter  Procedures  . Anti-islet cell antibody  . Glutamic acid decarboxylase auto abs  . C-peptide  . Glucose, Fasting  - I advised her to:  Patient Instructions  Please continue: - Basaglar 15 units at bedtime. - Januvia 100 mg in am - Metformin XR 1000 mg 2x a day  Please return in 3 months with your sugar log.   - check sugars at different times of the day - check 2 times a day, rotating checks - advised for yearly eye exams >> she is up to date - Hba1c today: 6.4% (impressive decrease)  - Return to clinic in 3 mo with sugar log   Component     Latest Ref Rng & Units 06/14/2016  Hemoglobin A1C      6.4  Pancreatic Islet Cell Antibody     <5 JDF Units <5  Glutamic Acid Decarb Ab     <5 IU/mL <5  C-Peptide     0.80 - 3.85 ng/mL 2.64  Glucose, Fasting     65 - 99 mg/dL 161107 (H)   Labs consistent with type 2, rather than type 1 diabetes.  Carlus Pavlovristina Rafe Mackowski, MD PhD [facility]

## 2016-06-14 NOTE — Patient Instructions (Addendum)
Please continue: - Basaglar 15 units at bedtime. - Januvia 100 mg in am - Metformin XR 1000 mg 2x a day  Please stop at the lab.  Please return in 3 months with your sugar log.

## 2016-06-15 LAB — GLUCOSE, FASTING: GLUCOSE, FASTING: 107 mg/dL — AB (ref 65–99)

## 2016-06-15 LAB — C-PEPTIDE: C-Peptide: 2.64 ng/mL (ref 0.80–3.85)

## 2016-06-16 LAB — GLUTAMIC ACID DECARBOXYLASE AUTO ABS: Glutamic Acid Decarb Ab: <5 IU/mL (ref <5)

## 2016-06-19 LAB — ANTI-ISLET CELL ANTIBODY: Pancreatic Islet Cell Antibody: <5 JDF Units (ref <5)

## 2016-09-13 ENCOUNTER — Ambulatory Visit: Payer: BLUE CROSS/BLUE SHIELD | Admitting: Internal Medicine

## 2016-09-14 ENCOUNTER — Encounter (HOSPITAL_COMMUNITY): Payer: Self-pay | Admitting: *Deleted

## 2016-09-14 ENCOUNTER — Emergency Department (HOSPITAL_COMMUNITY)
Admission: EM | Admit: 2016-09-14 | Discharge: 2016-09-14 | Disposition: A | Discharge status: Home / Self Care | Payer: BLUE CROSS/BLUE SHIELD | Attending: Emergency Medicine | Admitting: Emergency Medicine

## 2016-09-14 DIAGNOSIS — Z87891 Personal history of nicotine dependence: Secondary | ICD-10-CM | POA: Diagnosis not present

## 2016-09-14 DIAGNOSIS — E119 Type 2 diabetes mellitus without complications: Secondary | ICD-10-CM | POA: Diagnosis not present

## 2016-09-14 DIAGNOSIS — Z794 Long term (current) use of insulin: Secondary | ICD-10-CM | POA: Diagnosis not present

## 2016-09-14 DIAGNOSIS — M5442 Lumbago with sciatica, left side: Secondary | ICD-10-CM | POA: Diagnosis not present

## 2016-09-14 MED ORDER — METHOCARBAMOL 500 MG PO TABS: 500.0000 mg | ORAL_TABLET | Freq: Every evening | ORAL | 0 refills | Status: DC | PRN

## 2016-09-14 MED ORDER — HYDROCODONE-ACETAMINOPHEN 5-325 MG PO TABS: 1.0000 | ORAL_TABLET | ORAL | 0 refills | Status: DC | PRN

## 2016-09-14 MED ORDER — IBUPROFEN 600 MG PO TABS: 600.0000 mg | ORAL_TABLET | Freq: Four times a day (QID) | ORAL | 0 refills | Status: DC | PRN

## 2016-09-14 MED ORDER — METHOCARBAMOL 500 MG PO TABS
1000.0000 mg | ORAL_TABLET | Freq: Once | ORAL | Status: AC
  Administered 2016-09-14: 1000 mg via ORAL
  Filled 2016-09-14: qty 2

## 2016-09-14 MED ORDER — GABAPENTIN 300 MG PO CAPS: 300.0000 mg | ORAL_CAPSULE | Freq: Three times a day (TID) | ORAL | 0 refills | Status: DC

## 2016-09-14 MED ORDER — HYDROCODONE-ACETAMINOPHEN 5-325 MG PO TABS
1.0000 | ORAL_TABLET | Freq: Once | ORAL | Status: AC
  Administered 2016-09-14: 1 via ORAL
  Filled 2016-09-14: qty 1

## 2016-09-14 MED ORDER — IBUPROFEN 400 MG PO TABS
600.0000 mg | ORAL_TABLET | Freq: Once | ORAL | Status: AC
  Administered 2016-09-14: 600 mg via ORAL
  Filled 2016-09-14: qty 1

## 2016-09-14 NOTE — Discharge Instructions (Signed)
Take Ibuprofen and Gabapentin three times daily Take Norco and Robaxin as needed Return for worsening symptoms

## 2016-09-14 NOTE — ED Notes (Signed)
Pt states she understands instructions. Home stable with husband. 

## 2016-09-14 NOTE — ED Triage Notes (Signed)
Pt reports hx of back problems. Yesterday started having pain to left side buttock/lower back. Woke up this am with numbness to left leg causing difficulty ambulating. No neuro deficits noted at triage.

## 2016-09-14 NOTE — ED Provider Notes (Signed)
[facility]-EMERGENCY DEPT Provider Note   CSN: [id number] Arrival date & time: 09/14/16  4098     History   Chief Complaint Chief Complaint  Patient presents with  . Back Pain  . Numbness    HPI Kelly Simon is a 50 y.o. female who presents with left leg pain and numbness. Past medical history significant for left sided sciatica due to L4-L5 disc protrusion, non-insulin dependent Type 2 DM, anemia, depression/anxiety. She states that she has had similar pain before in the past which responded well to steroid injections. Yesterday she started having left-sided buttock pain. The pain is constant, worse with movement. She denies acute injury or inciting event. Today the pain started radiating down to her toes. She endorses numbness and tingling to her toes. She denies bowel or bladder incontinence however does report some left-sided perianal numbness and left-sided leg weakness. She has been able to ambulate however states that she has to "hold on to things". Denies any fever, syncope, IVDU, hx of cancer.   HPI  Past Medical History:  Diagnosis Date  . Depression   . Diabetes mellitus without complication (HCC)   . Low back pain   . Numbness and tingling of left leg     Patient Active Problem List   Diagnosis Date Noted  . 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 Left-sided low back pain with left-sided sciatica 06/14/2014  . Uncontrolled diabetes mellitus type 2 without complications (HCC) 04/12/2014  . Anemia 05/02/2013  . BMI 29.0-29.9,adult 08/16/2012  . Depression with anxiety 07/12/2012    Past Surgical History:  Procedure Laterality Date  . ABDOMINAL HYSTERECTOMY  2005   ADENOMYOSIS; ovaries resected.  . CRYOABLATION  2004  . LAPAROSCOPIC ABDOMINAL EXPLORATION    . UPPER GASTROINTESTINAL ENDOSCOPY      OB History    No data available       Home Medications    Prior to Admission medications   Medication Sig Start  Date End Date Taking? Authorizing Provider  albuterol (PROVENTIL HFA;VENTOLIN HFA) 108 (90 BASE) MCG/ACT inhaler Inhale 1-2 puffs into the lungs every 6 (six) hours as needed for wheezing or shortness of breath.    Historical Provider, MD  Insulin Glargine (BASAGLAR KWIKPEN) 100 UNIT/ML SOPN Inject 0.15 mLs (15 Units total) into the skin at bedtime. 06/14/16   Carlus Pavlov, MD  Insulin Pen Needle (FIFTY50 PEN NEEDLES) 32G X 4 MM MISC Use 1x a day 06/14/16   Carlus Pavlov, MD  metFORMIN (GLUCOPHAGE-XR) 500 MG 24 hr tablet Take 2 tablets (1,000 mg total) by mouth 2 (two) times daily. 06/14/16   Carlus Pavlov, MD  ONE TOUCH ULTRA TEST test strip USE THREE TIMES A DAY 03/22/16   Carlus Pavlov, MD  sitaGLIPtin (JANUVIA) 100 MG tablet TAKE 1 TABLET DAILY 06/14/16   Carlus Pavlov, MD  traMADol (ULTRAM) 50 MG tablet Take 50 mg by mouth 2 (two) times daily. Reported on 04/22/2016 10/17/14   Historical Provider, MD    Family History Family History  Problem Relation Age of Onset  . Cancer Mother     Cervical s/p hysterectomy; mets to the abdomen  . Hypertension Mother   . Cancer Father     prostate cancer    Social History Social History  Substance Use Topics  . Smoking status: Former Smoker    Years: 26.00    Types: Cigarettes    Quit date: 04/03/2008  . Smokeless tobacco: Never Used  . Alcohol use  0.0 oz/week     Comment: Occasionally     Allergies   Metformin and related and Adhesive [tape]   Review of Systems Review of Systems  Constitutional: Negative for fever.  Genitourinary: Negative for difficulty urinating.       -bowel/bladder incontinence  Musculoskeletal: Positive for back pain, gait problem and myalgias.  Neurological: Positive for weakness and numbness.  All other systems reviewed and are negative.    Physical Exam Updated Vital Signs BP 132/67 (BP Location: Right Arm)   Pulse 72   Temp 98.5 F (36.9 C) (Oral)   Resp 18   Ht 5\' 4"  (1.626 m)   Wt 95.7  kg   SpO2 98%   BMI 36.22 kg/m   Physical Exam  Constitutional: She is oriented to person, place, and time. She appears well-developed and well-nourished. No distress.  HENT:  Head: Normocephalic and atraumatic.  Eyes: Conjunctivae are normal. Pupils are equal, round, and reactive to light. Right eye exhibits no discharge. Left eye exhibits no discharge. No scleral icterus.  Neck: Normal range of motion.  Cardiovascular: Normal rate.   Pulmonary/Chest: Effort normal. No respiratory distress.  Abdominal: She exhibits no distension.  Genitourinary:  Genitourinary Comments: Rectal: Subjective decreased sensation to left perianal region. Normal rectal tone.  Musculoskeletal:  Back: Inspection: No masses, deformity, or rash Palpation: Lumbar midline spinal tenderness with left sided gluteal muscle tenderness. Strength: 5/5 in right lower extremity and normal plantar and dorsiflexion. 4/5 in left lower extremity. Sensation: Decreased sensation to medial aspect of left thigh, calf, and feet. Tingling to anterior thigh Gait: Antalgic. Reflexes: Patellar reflex is 1+ bilaterally SLR: Negative seated straight leg raise   Neurological: She is alert and oriented to person, place, and time.  Skin: Skin is warm and dry.  Psychiatric: She has a normal mood and affect. Her behavior is normal.  Nursing note and vitals reviewed.    ED Treatments / Results  Labs (all labs ordered are listed, but only abnormal results are displayed) Labs Reviewed - No data to display  EKG  EKG Interpretation  Date/Time:  Tuesday September 14 2016 09:02:24 EST Ventricular Rate:  81 PR Interval:  158 QRS Duration: 74 QT Interval:  364 QTC Calculation: 422 R Axis:   51 Text Interpretation:  Normal sinus rhythm Normal ECG Confirmed by Lincoln Brighamees, Liz [phone]([id number]) on 09/14/2016 9:07:40 AM       Radiology No results found.  Procedures Procedures (including critical care time)  Medications Ordered in  ED Medications  ibuprofen (ADVIL,MOTRIN) tablet 600 mg (600 mg Oral Given 09/14/16 1410)  methocarbamol (ROBAXIN) tablet 1,000 mg (1,000 mg Oral Given 09/14/16 1410)  HYDROcodone-acetaminophen (NORCO/VICODIN) 5-325 MG per tablet 1 tablet (1 tablet Oral Given 09/14/16 1410)     Initial Impression / Assessment and Plan / ED Course  I have reviewed the triage vital signs and the nursing notes.  Pertinent labs & imaging results that were available during my care of the patient were reviewed by me and considered in my medical decision making (see chart for details).  Clinical Course    50 year old female presents with low back pain with left sided radicular pain. She is most tender around SI joint. Patient is afebrile, not tachycardic or tachypneic, normotensive, and not hypoxic. History is somewhat concerning however on exam patient is ambulatory, has good rectal tone, and reflexes bilaterally. No bowel/bladder incontinence or urinary retention. On review of EMR her symptoms were exactly the same several years ago. Pain treated  in ED. Rx given for same. Advised patient to make appt for possible injections again. Strict return precautions discussed.  Final Clinical Impressions(s) / ED Diagnoses   Final diagnoses:  Acute left-sided low back pain with left-sided sciatica    New Prescriptions Discharge Medication List as of 09/14/2016  3:15 PM    START taking these medications   Details  gabapentin (NEURONTIN) 300 MG capsule Take 1 capsule (300 mg total) by mouth 3 (three) times daily., Starting Tue 09/14/2016, Print    HYDROcodone-acetaminophen (NORCO/VICODIN) 5-325 MG tablet Take 1 tablet by mouth every 4 (four) hours as needed., Starting Tue 09/14/2016, Print    ibuprofen (ADVIL,MOTRIN) 600 MG tablet Take 1 tablet (600 mg total) by mouth every 6 (six) hours as needed., Starting Tue 09/14/2016, Print    methocarbamol (ROBAXIN) 500 MG tablet Take 1 tablet (500 mg total) by mouth at bedtime  and may repeat dose one time if needed., Starting Tue 09/14/2016, Print         Bethel BornKelly Marie Murray Durrell, PA-C 09/18/16 1017    Tilden FossaElizabeth Rees, MD 09/19/16 1513

## 2016-10-11 DIAGNOSIS — M461 Sacroiliitis, not elsewhere classified: Secondary | ICD-10-CM | POA: Diagnosis not present

## 2016-10-11 DIAGNOSIS — M545 Low back pain: Secondary | ICD-10-CM | POA: Diagnosis not present

## 2016-10-16 DIAGNOSIS — M545 Low back pain: Secondary | ICD-10-CM | POA: Diagnosis not present

## 2016-10-25 DIAGNOSIS — M461 Sacroiliitis, not elsewhere classified: Secondary | ICD-10-CM | POA: Diagnosis not present

## 2016-10-28 ENCOUNTER — Encounter: Payer: Self-pay | Admitting: Internal Medicine

## 2016-10-28 ENCOUNTER — Ambulatory Visit (INDEPENDENT_AMBULATORY_CARE_PROVIDER_SITE_OTHER): Payer: BLUE CROSS/BLUE SHIELD | Admitting: Internal Medicine

## 2016-10-28 VITALS — BP 140/92 | HR 94 | Ht 64.0 in | Wt 229.0 lb

## 2016-10-28 DIAGNOSIS — E1165 Type 2 diabetes mellitus with hyperglycemia: Secondary | ICD-10-CM | POA: Diagnosis not present

## 2016-10-28 DIAGNOSIS — IMO0001 Reserved for inherently not codable concepts without codable children: Secondary | ICD-10-CM

## 2016-10-28 DIAGNOSIS — Z794 Long term (current) use of insulin: Secondary | ICD-10-CM | POA: Diagnosis not present

## 2016-10-28 LAB — POCT GLYCOSYLATED HEMOGLOBIN (HGB A1C): Hemoglobin A1C: 8.2

## 2016-10-28 MED ORDER — BASAGLAR KWIKPEN 100 UNIT/ML ~~LOC~~ SOPN: 10.0000 [IU] | PEN_INJECTOR | Freq: Every day | SUBCUTANEOUS | 5 refills | Status: DC

## 2016-10-28 NOTE — Patient Instructions (Addendum)
Please decrease Basaglar to 10 units a day.  If sugars are <130 in am in 5 days, decrease to 5 units. If sugars are <130 in am in 5 days, stop.  Please continue: - Januvia 100 mg in am - Metformin XR 1000 mg 2x a day  Please return in 3 months with your sugar log.

## 2016-10-28 NOTE — Addendum Note (Signed)
Addended by: Darene LamerHOMPSON, Ger Ringenberg T on: 10/28/2016 04:33 PM   Modules accepted: Orders

## 2016-10-28 NOTE — Progress Notes (Signed)
Patient ID: Kelly Simon, female   DOB: 08/02/1966, 51 y.o.   MRN: [id number]  HPI: Kelly Simon is a 51 y.o.-year-old female, returning for f/u for DM2, dx 2006, now insulin-dependent, uncontrolled, without complications. Last visit 4.5 mo ago.  Since last visit, she was on Robaxin for back pain >> now off. Sugars were higher   Last hemoglobin A1c was: Lab Results  Component Value Date   HGBA1C 6.4 06/14/2016   HGBA1C >15 02/25/2016   HGBA1C 6.8 (H) 12/10/2014  She was on steroids for back pain in 09-08/2014: Prednisone taper pack, then injections in back. Sugars spiked then. She was taken off her diabetes meds in 2014 as her A1c was great >> sugars got out of control (in the 400s). She was on insulin before. We had to restart in 02/2016.  Pt is on a regimen of: - Januvia 100 mg in am - Metformin XR 1000 mg 2x a day - added 02/2016 - Basaglar 20 >> 15 units at bedtime - added 02/2016 She stopped Glipizide  And Glipizide 5 mg bid b/c of low CBGs. She had problems with Metformin - tried this in 2011: abdominal cramps and diarrhea.   Pt checks her sugars 2-3x a day and they are (per meter download): - am: 250-268 >> 84-98 >> 80s >> 92-94 >> 327-417 >> 85-90s >> 77, 96-116, 124, 132 >> 85-142 - 2.5 h after b'fast: 250-260 >> n/c >> 333-530 >> <100, 160 >> 89, 90 >> n/c - before lunch:93-104 >> 92-96, if more active: 80s >> 98-103 >> 315-375 >> 80-90s >> 93 >> n/c - 2h after lunch: 260s >> n/c >> 442 >> <100 >> n/c >> 80-90s - before dinner: 260s >> 79-97 >> 88-90s >> n/c >> 340-HIGH >> 100 >> 84-117, 131 >> n/c - 2h after dinner: n/c >> n/c >> 115-118 >> n/c - bedtime: n/c >> 78-90 >> n/c She had lows before stopping Glipizide (34, 58) >> 52 (stopped Glipizide since then), 79 >> 76 >> 77 >> 85; she has hypoglycemia awareness at 60s.  Highest sugar was 305 >> 104 >> 130 >> HI >> 160 >> 122 >> 132 >> 151 x1.  Pt's meals are: - Breakfast: yoghurt or bowl of  grits/coffee/banana - Lunch + Dinner: grilled chicken salad - Snacks: veggies, bananas, oranges, crackers; no sodas She has lost 139 lbs - increased exercise and water intake. Now gaining back on insulin.  - no CKD, last BUN/creatinine:  Lab Results  Component Value Date   BUN 10 04/22/2016   CREATININE 0.83 04/22/2016  She is not on an ACEI/ARB. - last set of lipids: Lab Results  Component Value Date   CHOL 117 04/22/2016   HDL 38.60 (L) 04/22/2016   LDLCALC 63 04/22/2016   TRIG 78.0 04/22/2016   CHOLHDL 3 04/22/2016  She is not on a statin. - last eye exam was:  Scanned Document on 01/09/2016  Component Date Value Ref Range Comments  . HM Diabetic Eye Exam 12/05/2015 No Retinopathy  No Retinopathy Has cataracts.    - + numbness, but no tingling in her feet.  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  ROS: Constitutional: + weight gain, no increased appetite, no fatigue, no subjective hyperthermia/hypothermia Eyes: no blurry vision, no xerophthalmia ENT: no sore throat, no nodules palpated in throat, dysphagia/odynophagia, no hoarseness Cardiovascular: no CP/SOB/palpitations/leg swelling Respiratory: no cough/no SOB Gastrointestinal: no N/V/D/C  Musculoskeletal: no muscle/joint aches, no back pain Skin: no rashes Neurological: no tremors/numbness/tingling/dizziness  I reviewed pt's medications, allergies, PMH, social hx, family hx, and changes were documented in the history of present illness. Otherwise, unchanged from my initial visit note. Changed from Flexeril to Tramadol.   PE: BP (!) 140/92 (BP Location: Left Arm, Patient Position: Sitting)   Pulse 94   Ht 5\' 4"  (1.626 m)   Wt 229 lb (103.9 kg)   SpO2 98%   BMI 39.31 kg/m  Body mass index is 39.31 kg/m.  Wt Readings from Last 3 Encounters:  10/28/16 229 lb (103.9 kg)  09/14/16 211 lb (95.7 kg)  06/14/16 218 lb (98.9 kg)   Constitutional: obese,  in NAD Eyes: PERRLA, EOMI, no exophthalmos ENT: moist mucous membranes, no thyromegaly, no cervical lymphadenopathy Cardiovascular: RRR, No MRG Respiratory: CTA B Gastrointestinal: abdomen soft, NT, ND, BS+ Musculoskeletal: no deformities, strength intact in all 4 Skin: moist, warm, no rashes Neurological: no tremor with outstretched hands, DTR normal in all 4  ASSESSMENT: 1. DM2, insulin-dependent, uncontrolled, without complications  Component     Latest Ref Rng & Units 06/14/2016  Hemoglobin A1C      6.4  Pancreatic Islet Cell Antibody     <5 JDF Units <5  Glutamic Acid Decarb Ab     <5 IU/mL <5  C-Peptide     0.80 - 3.85 ng/mL 2.64  Glucose, Fasting     65 - 99 mg/dL 454 (H)   Labs consistent with type 2, rather than type 1 diabetes.  PLAN:  1. Patient with long-standing DM with dramatically improved control after we started insulin and glipizide >> she came off Glipizide and we were able to  decrease Basaglar at last 2 visits. Her sugars are still good despite the Holidays and back pain >> will decrease Basaglar further and try to stop. - she is preparing to do a smoothie cleanse >> discussed about the possible need to stay off Metformin then - I advised her to:  Patient Instructions  Please decrease Basaglar to 10 units a day.  If sugars are <130 in am in 5 days, decrease to 5 units. If sugars are <130 in am in 5 days, stop.  Please continue: - Januvia 100 mg in am - Metformin XR 1000 mg 2x a day  Please return in 3 months with your sugar log.   - check sugars at different times of the day - check 2 times a day, rotating checks - advised for yearly eye exams >> she is up to date - we discussed about a referral to bariatrics when the office open - Hba1c today: 6.1% (great!) - Return to clinic in 3 mo with sugar log   Kelly Pavlov, MD PhD [facility] End[facility]in[facility]

## 2016-11-01 ENCOUNTER — Ambulatory Visit (INDEPENDENT_AMBULATORY_CARE_PROVIDER_SITE_OTHER): Payer: BLUE CROSS/BLUE SHIELD | Admitting: Family Medicine

## 2016-11-01 ENCOUNTER — Encounter: Payer: Self-pay | Admitting: Family Medicine

## 2016-11-01 VITALS — BP 132/74 | HR 86 | Temp 98.1°F | Ht 64.0 in | Wt 230.2 lb

## 2016-11-01 DIAGNOSIS — E118 Type 2 diabetes mellitus with unspecified complications: Secondary | ICD-10-CM

## 2016-11-01 DIAGNOSIS — R431 Parosmia: Secondary | ICD-10-CM

## 2016-11-01 DIAGNOSIS — Z794 Long term (current) use of insulin: Secondary | ICD-10-CM

## 2016-11-01 DIAGNOSIS — Z23 Encounter for immunization: Secondary | ICD-10-CM | POA: Diagnosis not present

## 2016-11-01 DIAGNOSIS — R1311 Dysphagia, oral phase: Secondary | ICD-10-CM | POA: Diagnosis not present

## 2016-11-01 MED ORDER — FLUTICASONE PROPIONATE 50 MCG/ACT NA SUSP: 2.0000 | Freq: Every day | NASAL | 6 refills | Status: DC

## 2016-11-01 NOTE — Patient Instructions (Addendum)
IF you received an x-ray today, you will receive an invoice from [facility]. Please contact [facility] at [phone] with questions or concerns regarding your invoice.   IF you received labwork today, you will receive an invoice from [facility]. Please contact [facility] at [phone] with questions or concerns regarding your invoice.   Our billing staff will not be able to assist you with questions regarding bills from these companies.  You will be contacted with the lab results as soon as they are available. The fastest way to get your results is to activate your My Chart account. Instructions are located on the last page of this paperwork. If you have not heard from [place] regarding the results in 2 weeks, please contact this office.    Dysphagia Swallowing problems (dysphagia) occur when solids and liquids seem to stick in your throat on the way down to your stomach, or the food takes longer to get to the stomach. Other symptoms include regurgitating food, noises coming from the throat, chest discomfort with swallowing, and a feeling of fullness or the feeling of something being stuck in your throat when swallowing. When blockage in your throat is complete, it may be associated with drooling. CAUSES  Problems with swallowing may occur because of problems with the muscles. The food cannot be propelled in the usual manner into your stomach. You may have ulcers, scar tissue, or inflammation in the tube down which food travels from your mouth to your stomach (esophagus), which blocks food from passing normally into the stomach. Causes of inflammation include:  Acid reflux from your stomach into your esophagus.  Infection.  Radiation treatment for cancer.  Medicines taken without enough fluids to wash them down into your stomach. You may have nerve problems that prevent signals from being sent to the muscles of your esophagus to contract and move your food down to your  stomach. Globus pharyngeus is a relatively common problem in which there is a sense of an obstruction or difficulty in swallowing, without any physical abnormalities of the swallowing passages being found. This problem usually improves over time with reassurance and testing to rule out other causes. DIAGNOSIS Dysphagia can be diagnosed and its cause can be determined by tests in which you swallow a white substance that helps illuminate the inside of your throat (contrast medium) while X-rays are taken. Sometimes a flexible telescope that is inserted down your throat (endoscopy) to look at your esophagus and stomach is used. TREATMENT   If the dysphagia is caused by acid reflux or infection, medicines may be used.  If the dysphagia is caused by problems with your swallowing muscles, swallowing therapy may be used to help you strengthen your swallowing muscles.  If the dysphagia is caused by a blockage or mass, procedures to remove the blockage may be done. HOME CARE INSTRUCTIONS  Try to eat soft food that is easier to swallow and check your weight on a daily basis to be sure that it is not decreasing.  Be sure to drink liquids when sitting upright (not lying down). SEEK MEDICAL CARE IF:  You are losing weight because you are unable to swallow.  You are coughing when you drink liquids (aspiration).  You are coughing up partially digested food. SEEK IMMEDIATE MEDICAL CARE IF:  You are unable to swallow your own saliva?Marland Kitchen  You are having shortness of breath or a fever, or both.?  You have a hoarse voice along with difficulty swallowing. MAKE SURE YOU:  Understand these instructions.  Will watch your condition.  Will get help right away if you are not doing well or get worse. This information is not intended to replace advice given to you by your health care provider. Make sure you discuss any questions you have with your health care provider. Document Released: 09/17/2000 Document  Revised: 10/11/2014 Document Reviewed: 03/09/2013 Elsevier Interactive Patient Education  2017 [employer]

## 2016-11-01 NOTE — Progress Notes (Signed)
Chief Complaint  Patient presents with  . Swallowing Difficulties    X 1day hard time swallowing    HPI   Pt reports that she has been having difficulty swallowing for the past 24 hours.  She reports htat she feels like her glands are swollen when she swallows. Denies burning pain but has a raw feeling in the back of her throat. She does not smoke but reports that she smells car fume exhaust. She smoked 1 year ago and was smoking for 12 years prior. She reports that she took a shower and reports that she does not smell fruity fragrances She denies sinus congestion She has 2 dogs and 2 cats at home She denies any allergies She works on the phone in Clinical biochemistcustomer service.  Diabetes Mellitus: Patient reports that she recently had a diabetes follow up and her a1c was 4.6% compared to the 6.4% from 9/17 She reports that she was told to decrease her medications She states that she adheres to a diabetic diet Her labs were done at that visit 10/28/16. Lab Results  Component Value Date   HGBA1C 8.2 10/28/2016    Past Medical History:  Diagnosis Date  . Depression   . Diabetes mellitus without complication (HCC)   . Low back pain   . Numbness and tingling of left leg     Current Outpatient Prescriptions  Medication Sig Dispense Refill  . ibuprofen (ADVIL,MOTRIN) 600 MG tablet Take 1 tablet (600 mg total) by mouth every 6 (six) hours as needed. 30 tablet 0  . Insulin Glargine (BASAGLAR KWIKPEN) 100 UNIT/ML SOPN Inject 0.1 mLs (10 Units total) into the skin at bedtime. 15 mL 5  . Insulin Pen Needle (FIFTY50 PEN NEEDLES) 32G X 4 MM MISC Use 1x a day 100 each 1  . metFORMIN (GLUCOPHAGE-XR) 500 MG 24 hr tablet Take 2 tablets (1,000 mg total) by mouth 2 (two) times daily. 360 tablet 3  . ONE TOUCH ULTRA TEST test strip USE THREE TIMES A DAY 300 each 1  . sitaGLIPtin (JANUVIA) 100 MG tablet TAKE 1 TABLET DAILY 90 tablet 3  . albuterol (PROVENTIL HFA;VENTOLIN HFA) 108 (90 BASE) MCG/ACT inhaler  Inhale 1-2 puffs into the lungs every 6 (six) hours as needed for wheezing or shortness of breath.    . fluticasone (FLONASE) 50 MCG/ACT nasal spray Place 2 sprays into both nostrils daily. 16 g 6   No current facility-administered medications for this visit.     Allergies:  Allergies  Allergen Reactions  . Metformin And Related     diarrhea  . Adhesive [Tape] Rash    Past Surgical History:  Procedure Laterality Date  . ABDOMINAL HYSTERECTOMY  2005   ADENOMYOSIS; ovaries resected.  . CRYOABLATION  2004  . LAPAROSCOPIC ABDOMINAL EXPLORATION    . UPPER GASTROINTESTINAL ENDOSCOPY      Social History   Social History  . Marital status: Single    Spouse name: Cristal DeerChristopher  . Number of children: 3  . Years of education: 12+   Occupational History  . CUSTOMER SERVICE SUPERVISOR [employer]   Social History Main Topics  . Smoking status: Former Smoker    Years: 26.00    Types: Cigarettes    Quit date: 04/03/2008  . Smokeless tobacco: Never Used  . Alcohol use 0.0 oz/week     Comment: Occasionally  . Drug use: No  . Sexual activity: Yes    Partners: Male    Birth control/ protection: Surgical   Other Topics  Concern  . None   Social History Narrative   Lives at home with husband.    Three sons are grown and live independently.    Right-handed.   2 cups caffeine/day.    ROS  Objective: Vitals:   11/01/16 1034  BP: 132/74  Pulse: 86  Temp: 98.1 F (36.7 C)  TempSrc: Oral  SpO2: 96%  Weight: 230 lb 3.2 oz (104.4 kg)  Height: 5\' 4"  (1.626 m)    Physical Exam  Constitutional: She is oriented to person, place, and time. She appears well-developed and well-nourished.  HENT:  Head: Normocephalic and atraumatic.  Right Ear: Tympanic membrane, external ear and ear canal normal.  Left Ear: Tympanic membrane, external ear and ear canal normal.  Nose: Mucosal edema present. No nasal deformity. Right sinus exhibits no maxillary sinus tenderness and no frontal  sinus tenderness. Left sinus exhibits no maxillary sinus tenderness and no frontal sinus tenderness.  Mouth/Throat: Mucous membranes are dry. She has dentures. No oral lesions. No uvula swelling. No oropharyngeal exudate, posterior oropharyngeal edema or posterior oropharyngeal erythema. No tonsillar exudate.  Eyes: Conjunctivae and EOM are normal.  Cardiovascular: Normal rate, regular rhythm and normal heart sounds.   Pulmonary/Chest: Effort normal and breath sounds normal. No respiratory distress. She has no wheezes.  Neurological: She is alert and oriented to person, place, and time.    Assessment and Plan Kelly Simon was seen today for swallowing difficulties.  Diagnoses and all orders for this visit:  Need for prophylactic vaccination and inoculation against influenza -     Flu Vaccine QUAD 36+ mos IM  Abnormal smell Oral phase dysphagia -   Discussed that she should follow up with ENT for both issues -     Ambulatory referral to ENT  Type 2 diabetes mellitus with complication, with long-term current use of insulin (HCC)- advised pt to contact her provider to review her labs She should probably increase her medications because her a1c worsened -     HM Diabetes Foot Exam  Other orders -     fluticasone (FLONASE) 50 MCG/ACT nasal spray; Place 2 sprays into both nostrils daily.     Berry Gallacher A Namiyah Grantham

## 2016-11-10 DIAGNOSIS — R490 Dysphonia: Secondary | ICD-10-CM | POA: Diagnosis not present

## 2016-11-10 DIAGNOSIS — J322 Chronic ethmoidal sinusitis: Secondary | ICD-10-CM | POA: Diagnosis not present

## 2016-11-10 DIAGNOSIS — J040 Acute laryngitis: Secondary | ICD-10-CM | POA: Diagnosis not present

## 2016-11-10 DIAGNOSIS — J320 Chronic maxillary sinusitis: Secondary | ICD-10-CM | POA: Diagnosis not present

## 2016-11-14 LAB — HM DIABETES EYE EXAM

## 2016-11-16 DIAGNOSIS — J322 Chronic ethmoidal sinusitis: Secondary | ICD-10-CM | POA: Diagnosis not present

## 2016-11-16 DIAGNOSIS — J320 Chronic maxillary sinusitis: Secondary | ICD-10-CM | POA: Diagnosis not present

## 2016-11-16 DIAGNOSIS — J040 Acute laryngitis: Secondary | ICD-10-CM | POA: Diagnosis not present

## 2016-11-17 DIAGNOSIS — Z6839 Body mass index (BMI) 39.0-39.9, adult: Secondary | ICD-10-CM | POA: Diagnosis not present

## 2016-11-17 DIAGNOSIS — M461 Sacroiliitis, not elsewhere classified: Secondary | ICD-10-CM | POA: Diagnosis not present

## 2016-11-17 DIAGNOSIS — R030 Elevated blood-pressure reading, without diagnosis of hypertension: Secondary | ICD-10-CM | POA: Diagnosis not present

## 2017-01-26 ENCOUNTER — Ambulatory Visit: Payer: BLUE CROSS/BLUE SHIELD | Admitting: Internal Medicine

## 2017-03-20 ENCOUNTER — Other Ambulatory Visit: Payer: Self-pay | Admitting: Internal Medicine

## 2017-05-18 DIAGNOSIS — M461 Sacroiliitis, not elsewhere classified: Secondary | ICD-10-CM | POA: Diagnosis not present

## 2017-05-18 DIAGNOSIS — R030 Elevated blood-pressure reading, without diagnosis of hypertension: Secondary | ICD-10-CM | POA: Diagnosis not present

## 2017-05-18 DIAGNOSIS — Z6841 Body Mass Index (BMI) 40.0 and over, adult: Secondary | ICD-10-CM | POA: Diagnosis not present

## 2017-08-13 ENCOUNTER — Other Ambulatory Visit: Payer: Self-pay | Admitting: Internal Medicine

## 2017-11-23 LAB — HM DIABETES EYE EXAM

## 2017-12-06 ENCOUNTER — Other Ambulatory Visit: Payer: Self-pay | Admitting: Internal Medicine

## 2017-12-06 ENCOUNTER — Ambulatory Visit: Payer: BLUE CROSS/BLUE SHIELD | Admitting: Internal Medicine

## 2017-12-06 ENCOUNTER — Encounter: Payer: Self-pay | Admitting: Internal Medicine

## 2017-12-06 VITALS — BP 116/82 | HR 101 | Ht 64.0 in | Wt 218.2 lb

## 2017-12-06 DIAGNOSIS — Z6837 Body mass index (BMI) 37.0-37.9, adult: Secondary | ICD-10-CM | POA: Diagnosis not present

## 2017-12-06 DIAGNOSIS — E1165 Type 2 diabetes mellitus with hyperglycemia: Secondary | ICD-10-CM | POA: Diagnosis not present

## 2017-12-06 DIAGNOSIS — E66812 Obesity, class 2: Secondary | ICD-10-CM

## 2017-12-06 DIAGNOSIS — IMO0001 Reserved for inherently not codable concepts without codable children: Secondary | ICD-10-CM

## 2017-12-06 LAB — POCT GLYCOSYLATED HEMOGLOBIN (HGB A1C): HEMOGLOBIN A1C: 14.3

## 2017-12-06 MED ORDER — INSULIN PEN NEEDLE 32G X 4 MM MISC: 3 refills | Status: DC

## 2017-12-06 MED ORDER — BASAGLAR KWIKPEN 100 UNIT/ML ~~LOC~~ SOPN: 15.0000 [IU] | PEN_INJECTOR | Freq: Every day | SUBCUTANEOUS | 11 refills | Status: DC

## 2017-12-06 NOTE — Progress Notes (Signed)
Patient ID: Kelly Simon, female   DOB: 04-01-1966, 52 y.o.   MRN: [id number]  HPI: Kelly Simon is a 52 y.o.-year-old female, returning for f/u for DM2, dx 2006, previously insulin-dependent, uncontrolled, without long-term complications. Last visit 1 year and 1.5 months!  Last hemoglobin A1c was: Lab Results  Component Value Date   HGBA1C 8.2 10/28/2016   HGBA1C 6.4 06/14/2016   HGBA1C >15 02/25/2016  She was on steroids for back pain in 09-08/2014: Prednisone taper pack, then injections in back. Sugars spiked then. She was taken off her diabetes meds in 2014 as her A1c was great >> sugars got out of control (in the 400s). She was on insulin before. We had to restart in 02/2016.  Pt is on a regimen of: - Januvia 100 mg in am - Metformin XR 1000mg  2x a day - Basaglar 20 >> 15 units at bedtime - added 02/2016 >> 10 units >> ran out 09/2017  She stopped Glipizide  And Glipizide 5 mg bid b/c of low CBGs. She had problems with Metformin - tried this in 2011: abdominal cramps and diarrhea.   Pt checks her sugars 2-3 times a day - am: 77, 96-116, 124, 132 >> 85-142 >> 130-132  (on insulin), now 250-398 (off insulin) - 2.5 h after b'fast: <100, 160 >> 89, 90 >> n/c - before lunch:315-375 >> 80-90s >> 93 >> n/c - 2h after lunch: <100 >> n/c >> 80-90s  >> n/c - before dinner: 100 >> 84-117, 131 >> n/c - 2h after dinner: n/c >> 115-118 >> n/c  - bedtime: n/c >> 78-90 >> n/c >> 340-350 She had lows before stopping Glipizide (34, 58) >> since then: 72; she has hypoglycemia awareness in the 60s Highest sugar was 151 x1 >> 398.  Pt's meals are: - Breakfast: yoghurt or bowl of grits/coffee/banana - Lunch + Dinner: grilled chicken salad - Snacks: veggies, bananas, oranges, crackers; no sodas  -No CKD, last BUN/creatinine:  Lab Results  Component Value Date   BUN 10 04/22/2016   CREATININE 0.83 04/22/2016  She is not on an ACE inhibitor/ARB. -+ Dyslipidemia; last set of  lipids: Lab Results  Component Value Date   CHOL 117 04/22/2016   HDL 38.60 (L) 04/22/2016   LDLCALC 63 04/22/2016   TRIG 78.0 04/22/2016   CHOLHDL 3 04/22/2016  She is not on a statin. -  she is up-to-date with eye exams - last 11/2017: No DR -+ Numbness, but no tingling in her feet.  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  ROS: Constitutional: no weight gain/no weight loss, no fatigue, no subjective hyperthermia, no subjective hypothermia Eyes: no blurry vision, no xerophthalmia ENT: no sore throat, no nodules palpated in throat, no dysphagia, no odynophagia, no hoarseness Cardiovascular: no CP/no SOB/no palpitations/no leg swelling Respiratory: no cough/no SOB/no wheezing Gastrointestinal: no N/no V/no D/no C/no acid reflux Musculoskeletal: no muscle aches/no joint aches Skin: no rashes, no hair loss Neurological: no tremors/no numbness/no tingling/no dizziness  I reviewed pt's medications, allergies, PMH, social hx, family hx, and changes were documented in the history of present illness. Otherwise, unchanged from my initial visit note.  PE: BP 116/82   Pulse (!) 101   Ht 5\' 4"  (1.626 m)   Wt 218 lb 3.2 oz (99 kg)   SpO2 99%   BMI 37.45 kg/m  Body mass index is 37.45 kg/m.  Wt Readings from Last  3 Encounters:  12/06/17 218 lb 3.2 oz (99 kg)  11/01/16 230 lb 3.2 oz (104.4 kg)  10/28/16 229 lb (103.9 kg)   Constitutional: overweight, in NAD Eyes: PERRLA, EOMI, no exophthalmos ENT: moist mucous membranes, no thyromegaly, no cervical lymphadenopathy Cardiovascular: tachycardia, RR, No MRG Respiratory: CTA B Gastrointestinal: abdomen soft, NT, ND, BS+ Musculoskeletal: no deformities, strength intact in all 4 Skin: moist, warm, no rashes Neurological: no tremor with outstretched hands, DTR normal in all 4  ASSESSMENT: 1. DM2, insulin-dependent, uncontrolled, without long-term complications  Component      Latest Ref Rng & Units 06/14/2016  Hemoglobin A1C      6.4  Pancreatic Islet Cell Antibody     <5 JDF Units <5  Glutamic Acid Decarb Ab     <5 IU/mL <5  C-Peptide     0.80 - 3.85 ng/mL 2.64  Glucose, Fasting     65 - 99 mg/dL 409107 (H)   Labs consistent with type 2, rather than type 1 diabetes.  2. Obesity  PLAN:  1. Patient with long-standing type 2 diabetes, with significant improvement in control after we started insulin and glipizide.  She was able to come off glipizide and at last visit, we were also decreasing her Basaglar - but she could not stop it, she tells me - lowest dose she got to was 10 units daily.  - However, she was lost for follow-up after her last appointment 1 year and 1 month ago >> her insulin Rx was not refilled after she ran out in 09/2017 >> sugars started to increase >> very high this month - we will need to restart Basaglar and I advised her how to titrate the dose up if needed - I advised her to:  Patient Instructions  Please continue: - Januvia 100 mg in am - Metformin XR 1000 mg 2x a day  Please restart: - Basaglar 15 units at bedtime  Please return in 3 months with your sugar log.   - today, HbA1c is 14.3% (Much higher) - continue checking sugars at different times of the day - check 1-2x a day, rotating checks - advised for yearly eye exams >> she is UTD - will check annual labs at next visit - Return to clinic in 3 mo with sugar log    2. Obesity - lost weight since last visit (~12 lbs) but I explained that this is likely 2/2 glucotoxicity  Kelly Pavlovristina Madelein Mahadeo, MD PhD [facility]

## 2017-12-06 NOTE — Patient Instructions (Signed)
Please continue: - Januvia 100 mg in am - Metformin XR 1000 mg 2x a day  Please restart: - Basaglar 15 units at bedtime  Please return in 3 months with your sugar log.

## 2017-12-07 ENCOUNTER — Telehealth: Payer: Self-pay | Admitting: Internal Medicine

## 2017-12-07 NOTE — Telephone Encounter (Signed)
Patient was seen yesterday-Dr prescribed insulin and needles for the insulin. Pharmacy did not receive the Rx for insulin. Please send RX for insulin to [facility] on [address]

## 2017-12-07 NOTE — Telephone Encounter (Signed)
Spoke to patient. Advised med e-scribed, receipt confirmed for this a.m. Patient said verbalized understanding.

## 2018-01-18 ENCOUNTER — Other Ambulatory Visit: Payer: Self-pay | Admitting: *Deleted

## 2018-01-18 MED ORDER — BASAGLAR KWIKPEN 100 UNIT/ML ~~LOC~~ SOPN: 15.0000 [IU] | PEN_INJECTOR | Freq: Every day | SUBCUTANEOUS | 3 refills | Status: DC

## 2018-01-18 MED ORDER — INSULIN PEN NEEDLE 32G X 4 MM MISC: 3 refills | Status: DC

## 2018-01-19 ENCOUNTER — Other Ambulatory Visit: Payer: Self-pay

## 2018-01-19 MED ORDER — INSULIN PEN NEEDLE 32G X 4 MM MISC: 3 refills | Status: DC

## 2018-03-13 ENCOUNTER — Ambulatory Visit: Payer: BLUE CROSS/BLUE SHIELD | Admitting: Internal Medicine

## 2018-03-13 ENCOUNTER — Encounter: Payer: Self-pay | Admitting: Internal Medicine

## 2018-03-13 VITALS — BP 130/70 | HR 72 | Ht 64.0 in | Wt 235.4 lb

## 2018-03-13 DIAGNOSIS — E1165 Type 2 diabetes mellitus with hyperglycemia: Secondary | ICD-10-CM

## 2018-03-13 DIAGNOSIS — Z6837 Body mass index (BMI) 37.0-37.9, adult: Secondary | ICD-10-CM | POA: Diagnosis not present

## 2018-03-13 DIAGNOSIS — IMO0001 Reserved for inherently not codable concepts without codable children: Secondary | ICD-10-CM

## 2018-03-13 DIAGNOSIS — E785 Hyperlipidemia, unspecified: Secondary | ICD-10-CM | POA: Diagnosis not present

## 2018-03-13 LAB — COMPLETE METABOLIC PANEL WITH GFR
AG RATIO: 1.6 (calc) (ref 1.0–2.5)
ALBUMIN MSPROF: 3.9 g/dL (ref 3.6–5.1)
ALKALINE PHOSPHATASE (APISO): 82 U/L (ref 33–130)
ALT: 9 U/L (ref 6–29)
AST: 11 U/L (ref 10–35)
BUN: 12 mg/dL (ref 7–25)
CHLORIDE: 108 mmol/L (ref 98–110)
CO2: 25 mmol/L (ref 20–32)
Calcium: 9.2 mg/dL (ref 8.6–10.4)
Creat: 0.79 mg/dL (ref 0.50–1.05)
GFR, Est African American: 101 mL/min/{1.73_m2} (ref >=60)
GFR, Est Non African American: 87 mL/min/{1.73_m2} (ref >=60)
GLUCOSE: 82 mg/dL (ref 65–99)
Globulin: 2.5 g/dL (calc) (ref 1.9–3.7)
POTASSIUM: 4.3 mmol/L (ref 3.5–5.3)
SODIUM: 141 mmol/L (ref 135–146)
Total Bilirubin: 0.4 mg/dL (ref 0.2–1.2)
Total Protein: 6.4 g/dL (ref 6.1–8.1)

## 2018-03-13 LAB — LIPID PANEL
CHOLESTEROL: 129 mg/dL (ref 0–200)
HDL: 39.1 mg/dL (ref >39.00)
LDL Cholesterol: 69 mg/dL (ref 0–99)
NonHDL: 89.77
Total CHOL/HDL Ratio: 3
Triglycerides: 105 mg/dL (ref 0.0–149.0)
VLDL: 21 mg/dL (ref 0.0–40.0)

## 2018-03-13 LAB — MICROALBUMIN / CREATININE URINE RATIO
Creatinine,U: 177.9 mg/dL
MICROALB/CREAT RATIO: 1 mg/g (ref 0.0–30.0)
Microalb, Ur: 1.8 mg/dL (ref 0.0–1.9)

## 2018-03-13 LAB — POCT GLYCOSYLATED HEMOGLOBIN (HGB A1C): HEMOGLOBIN A1C: 6.9 % — AB (ref 4.0–5.6)

## 2018-03-13 MED ORDER — BASAGLAR KWIKPEN 100 UNIT/ML ~~LOC~~ SOPN: 10.0000 [IU] | PEN_INJECTOR | Freq: Every day | SUBCUTANEOUS | 3 refills | Status: DC

## 2018-03-13 NOTE — Patient Instructions (Addendum)
Please continue: - Metformin ER 1000 mg 2x a day - Januvia 100 mg daily in am  Please decrease:  - Basaglar to 10 units at bedtime  Please return in 3-4 months with your sugar log.

## 2018-03-13 NOTE — Progress Notes (Signed)
Patient ID: Kelly Simon, female   DOB: [birth year], 52 y.o.   MRN: [id number]  HPI: Kelly Simon is a 52 y.o.-year-old female, returning for f/u for DM2, dx 2006, insulin-dependent, uncontrolled, without long-term complications. Last visit 3 months ago.  Last hemoglobin A1c was: Lab Results  Component Value Date   HGBA1C 14.3 12/06/2017   HGBA1C 8.2 10/28/2016   HGBA1C 6.4 06/14/2016   HGBA1C >15 02/25/2016   HGBA1C 6.8 (H) 12/10/2014   HGBA1C 7.2 (H) 09/10/2014   HGBA1C >14.0 04/12/2014   HGBA1C >14.0 11/15/2013   HGBA1C 6.0 05/02/2013   HGBA1C 5.4 01/17/2013   HGBA1C 7.6 10/12/2012   HGBA1C =>14.0 07/12/2012  She was on steroids for back pain in 09-08/2014: Prednisone taper pack, then injections in back. Sugars spiked then. She was taken off her diabetes meds in 2014 as her A1c was great >> sugars got out of control (in the 400s).  Pt is on a regimen of: - Januvia 100 mg in am - Metformin XR 1000 mg 2x a day - Basaglar 20 >> 15 units at bedtime - added 02/2016 >> 10 units >> ran out 09/2017 >> restarted 15 units at bedtime 12/2017 She stopped Glipizide  And Glipizide 5 mg bid b/c of low CBGs. She had problems with Metformin - tried this in 2011: abdominal cramps and diarrhea.   Pt checks her sugars 2 2-3X a day: - am:  130-132  (on insulin), now 250-398 (off insulin)  >> 80s-90 - 2.5 h after b'fast: <100, 160 >> 89, 90 >> n/c >> 90 - before lunch:315-375 >> 80-90s >> 93 >> n/c - 2h after lunch: <100 >> n/c >> 80-90s  >> n/c - before dinner: 100 >> 84-117, 131 >> n/c - 2h after dinner: n/c >> 115-118 >> n/c  - bedtime: n/c >> 78-90 >> n/c >> 340-350 >> 90-97 She had lows before stopping Glipizide (34, 58) >> since then: 72 >> 80; she has hypoglycemia awareness in the 60s. Highest sugar was 151 x1 >> 398 >> last 2 week: 90s.  Pt's meals are: - Breakfast: yoghurt or bowl of grits/coffee/banana - Lunch + Dinner: grilled chicken salad Dyslipidemia- Snacks:  veggies, bananas, oranges, crackers; no sodas  -No CKD, last BUN/creatinine:  Lab Results  Component Value Date   BUN 10 04/22/2016   CREATININE 0.83 04/22/2016  S not on an ACE inhibitor/ARB -+ Dyslipidemia; last set of lipids: Lab Results  Component Value Date   CHOL 117 04/22/2016   HDL 38.60 (L) 04/22/2016   LDLCALC 63 04/22/2016   TRIG 78.0 04/22/2016   CHOLHDL 3 04/22/2016  Not on a statin. - Latest eye exam 11/2017: No DR - + Numbness, but now tingling in her feet.  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  ROS: Constitutional: no weight gain/no weight loss, no fatigue, no subjective hyperthermia, no subjective hypothermia Eyes: no blurry vision, no xerophthalmia ENT: no sore throat, no nodules palpated in throat, no dysphagia, no odynophagia, no hoarseness Cardiovascular: no CP/no SOB/no palpitations/no leg swelling Respiratory: no cough/no SOB/no wheezing Gastrointestinal: no N/no V/no D/no C/no acid reflux Musculoskeletal: no muscle aches/no joint aches Skin: no rashes, no hair loss Neurological: no tremors/no numbness/no tingling/no dizziness  I reviewed pt's medications, allergies, PMH, social hx, family hx, and changes were documented in the history of present illness. Otherwise, unchanged from my initial visit note.  Past Medical History:  Diagnosis  Date  . Depression   . Diabetes mellitus without complication (HCC)   . Low back pain   . Numbness and tingling of left leg    Past Surgical History:  Procedure Laterality Date  . ABDOMINAL HYSTERECTOMY  2005   ADENOMYOSIS; ovaries resected.  . CRYOABLATION  2004  . LAPAROSCOPIC ABDOMINAL EXPLORATION    . UPPER GASTROINTESTINAL ENDOSCOPY     Social History   Socioeconomic History  . Marital status: Single    Spouse name: [name]  . Number of children: 3  . Years of education: 12+  . Highest education level: Not on file  Occupational  History  . Occupation: CUSTOMER SERVICE SUPERVISOR    Employer: [employer]  Social Needs  . Financial resource strain: Not on file  . Food insecurity:    Worry: Not on file    Inability: Not on file  . Transportation needs:    Medical: Not on file    Non-medical: Not on file  Tobacco Use  . Smoking status: Former Smoker    Years: 26.00    Types: Cigarettes    Last attempt to quit: 04/03/2008    Years since quitting: 9.9  . Smokeless tobacco: Never Used  Substance and Sexual Activity  . Alcohol use: Yes    Alcohol/week: 0.0 oz    Comment: Occasionally  . Drug use: No  . Sexual activity: Yes    Partners: Male    Birth control/protection: Surgical  Lifestyle  . Physical activity:    Days per week: Not on file    Minutes per session: Not on file  . Stress: Not on file  Relationships  . Social connections:    Talks on phone: Not on file    Gets together: Not on file    Attends religious service: Not on file    Active member of club or organization: Not on file    Attends meetings of clubs or organizations: Not on file    Relationship status: Not on file  . Intimate partner violence:    Fear of current or ex partner: Not on file    Emotionally abused: Not on file    Physically abused: Not on file    Forced sexual activity: Not on file  Other Topics Concern  . Not on file  Social History Narrative   Lives at home with husband.    Three sons are grown and live independently.    Right-handed.   2 cups caffeine/day.   Current Outpatient Medications on File Prior to Visit  Medication Sig Dispense Refill  . Insulin Pen Needle (FIFTY50 PEN NEEDLES) 32G X 4 MM MISC Use 1x a day 100 each 3  . metFORMIN (GLUCOPHAGE-XR) 500 MG 24 hr tablet TAKE 2 TABLETS TWICE A DAY 360 tablet 3  . ONE TOUCH ULTRA TEST test strip USE 3 TIMES A DAY 300 each 1  . sitaGLIPtin (JANUVIA) 100 MG tablet TAKE 1 TABLET DAILY 90 tablet 3   No current facility-administered medications on file prior  to visit.    Allergies  Allergen Reactions  . Metformin And Related     diarrhea  . Adhesive [Tape] Rash   Family History  Problem Relation Age of Onset  . Cancer Mother        Cervical s/p hysterectomy; mets to the abdomen  . Hypertension Mother   . Cancer Father        prostate cancer    PE: BP 130/70   Pulse 72  Ht 5\' 4"  (1.626 m)   Wt 235 lb 6.4 oz (106.8 kg)   SpO2 94%   BMI 40.41 kg/m  Body mass index is 40.41 kg/m.  Wt Readings from Last 3 Encounters:  03/13/18 235 lb 6.4 oz (106.8 kg)  12/06/17 218 lb 3.2 oz (99 kg)  11/01/16 230 lb 3.2 oz (104.4 kg)   Constitutional: overweight, in NAD Eyes: PERRLA, EOMI, no exophthalmos ENT: moist mucous membranes, no thyromegaly, no cervical lymphadenopathy Cardiovascular: RRR, No MRG Respiratory: CTA B Gastrointestinal: abdomen soft, NT, ND, BS+ Musculoskeletal: no deformities, strength intact in all 4 Skin: moist, warm, no rashes Neurological: no tremor with outstretched hands, DTR normal in all 4  ASSESSMENT: 1. DM2, insulin-dependent, uncontrolled, without long-term complications, but with hyperglycemia  Component     Latest Ref Rng & Units 06/14/2016  Hemoglobin A1C      6.4  Pancreatic Islet Cell Antibody     <5 JDF Units <5  Glutamic Acid Decarb Ab     <5 IU/mL <5  C-Peptide     0.80 - 3.85 ng/mL 2.64  Glucose, Fasting     65 - 99 mg/dL 161107 (H)   Labs consistent with type 2, rather than type 1 diabetes.  2. Obesity  3.   PLAN:  1. Patient with long-standing, previously controlled type 2 diabetes, who returned 3 months ago after long absence, during which her HbA1c increased dramatically, from 8.2% to 14.3%.  At that time, we restarted Basaglar, which he took in the past but was able to come off, we discussed about how to titrate the dose up if needed.  We continued metformin and Januvia. - At this visit, sugars are all in the normal range, even lower than expected after meals.  She did not change  anything else other than adding the 15 units of insulin I recommended at last visit.  Since sugars are low after meals, we discussed about backing off the [employer].  She may need to come off insulin if she continues to do so well on low doses, however, we have to be careful since her sugars increased significantly in the past off the insulin.  We may need to recheck her for type 1 diabetes at next visit.  For now, we will check a CMP and lipid panel. - I advised her to:  Patient Instructions  Please continue: - Metformin ER 1000 mg 2x a day - Januvia 100 mg daily in am  Please decrease:  - Basaglar to 10 units at bedtime  Please return in 3-4 months with your sugar log.    - today, HbA1c is 6.9% (impressive decrease!) - continue checking sugars at different times of the day - check 1x a day, rotating checks - advised for yearly eye exams >> she is UTD - Return to clinic in 3 mo with sugar log    2. Obesity - Before last visit, she lost 12 pounds most likely due to glucose toxicity; since last visit, after we added insulin, she gained 17 pounds back. - We will decrease the insulin now and hopefully this will help with weight loss.  Trulicity is another option, instead of Januvia,   Which we may contemplate at next visit  3. Dyslipidemia reviewed lipid panel from last - Check: LDL at goal, as were the triglycerides, but HDL low - She is not on a statin - We will recheck the lipids today.  She is fasting.  Office Visit on 03/13/2018  Component Date Value  Ref Range Status  . Microalb, Ur 03/13/2018 1.8  0.0 - 1.9 mg/dL Final  . Creatinine,U 40/98/1191 177.9  mg/dL Final  . Microalb Creat Ratio 03/13/2018 1.0  0.0 - 30.0 mg/g Final  . Glucose, Bld 03/13/2018 82  65 - 99 mg/dL Final   Comment: .            Fasting reference interval .   . BUN 03/13/2018 12  7 - 25 mg/dL Final  . Creat 47/82/9562 0.79  0.50 - 1.05 mg/dL Final   Comment: For patients >52 years of age, the reference  limit for Creatinine is approximately 13% higher for people identified as African-American. .   . GFR, Est Non African American 03/13/2018 87  > OR = 60 mL/min/1.64m2 Final  . GFR, Est African American 03/13/2018 101  > OR = 60 mL/min/1.36m2 Final  . BUN/Creatinine Ratio 03/13/2018 NOT APPLICABLE  6 - 22 (calc) Final  . Sodium 03/13/2018 141  135 - 146 mmol/L Final  . Potassium 03/13/2018 4.3  3.5 - 5.3 mmol/L Final  . Chloride 03/13/2018 108  98 - 110 mmol/L Final  . CO2 03/13/2018 25  20 - 32 mmol/L Final  . Calcium 03/13/2018 9.2  8.6 - 10.4 mg/dL Final  . Total Protein 03/13/2018 6.4  6.1 - 8.1 g/dL Final  . Albumin 13/05/6577 3.9  3.6 - 5.1 g/dL Final  . Globulin 46/96/2952 2.5  1.9 - 3.7 g/dL (calc) Final  . AG Ratio 03/13/2018 1.6  1.0 - 2.5 (calc) Final  . Total Bilirubin 03/13/2018 0.4  0.2 - 1.2 mg/dL Final  . Alkaline phosphatase (APISO) 03/13/2018 82  33 - 130 U/L Final  . AST 03/13/2018 11  10 - 35 U/L Final  . ALT 03/13/2018 9  6 - 29 U/L Final  . Cholesterol 03/13/2018 129  0 - 200 mg/dL Final   ATP III Classification       Desirable:  < 200 mg/dL               Borderline High:  200 - 239 mg/dL          High:  > = 841 mg/dL  . Triglycerides 03/13/2018 105.0  0.0 - 149.0 mg/dL Final   Normal:  <324 mg/dLBorderline High:  150 - 199 mg/dL  . HDL 03/13/2018 39.10  >39.00 mg/dL Final  . VLDL 40/07/2724 21.0  0.0 - 40.0 mg/dL Final  . LDL Cholesterol 03/13/2018 69  0 - 99 mg/dL Final  . Total CHOL/HDL Ratio 03/13/2018 3   Final                  Men          Women1/2 Average Risk     3.4          3.3Average Risk          5.0          4.42X Average Risk          9.6          7.13X Average Risk          15.0          11.0                      . NonHDL 03/13/2018 89.77   Final   NOTE:  Non-HDL goal should be 30 mg/dL higher than patient's LDL goal (i.e. LDL goal of < 70 mg/dL, would have non-HDL  goal of < 100 mg/dL)  . Hemoglobin A1C 03/13/2018 6.9* 4.0 - 5.6 % Final   Labs are  great!  Carlus Pavlov, MD PhD [facility] Endocrinology

## 2018-05-03 ENCOUNTER — Other Ambulatory Visit: Payer: Self-pay | Admitting: Internal Medicine

## 2018-07-14 ENCOUNTER — Other Ambulatory Visit: Payer: Self-pay | Admitting: Internal Medicine

## 2018-07-28 ENCOUNTER — Ambulatory Visit: Payer: BLUE CROSS/BLUE SHIELD | Admitting: Internal Medicine

## 2018-07-28 ENCOUNTER — Encounter: Payer: Self-pay | Admitting: Internal Medicine

## 2018-07-28 VITALS — BP 128/70 | HR 87 | Ht 64.0 in | Wt 242.0 lb

## 2018-07-28 DIAGNOSIS — E1165 Type 2 diabetes mellitus with hyperglycemia: Secondary | ICD-10-CM

## 2018-07-28 DIAGNOSIS — Z6837 Body mass index (BMI) 37.0-37.9, adult: Secondary | ICD-10-CM

## 2018-07-28 DIAGNOSIS — E785 Hyperlipidemia, unspecified: Secondary | ICD-10-CM

## 2018-07-28 DIAGNOSIS — IMO0001 Reserved for inherently not codable concepts without codable children: Secondary | ICD-10-CM

## 2018-07-28 LAB — POCT GLYCOSYLATED HEMOGLOBIN (HGB A1C): Hemoglobin A1C: 6 % — AB (ref 4.0–5.6)

## 2018-07-28 MED ORDER — BASAGLAR KWIKPEN 100 UNIT/ML ~~LOC~~ SOPN: 6.0000 [IU] | PEN_INJECTOR | Freq: Every day | SUBCUTANEOUS | 3 refills | Status: DC

## 2018-07-28 NOTE — Progress Notes (Addendum)
Patient ID: Rebbie Lauricella, female   DOB: 1966-05-29, 52 y.o.   MRN: [id number]  HPI: Eureka Valdes is a 52 y.o.-year-old female, returning for f/u for DM2, dx 2006, insulin-dependent, uncontrolled, without long-term complications. Last visit 4.5 months ago.  Last hemoglobin A1c was: Lab Results  Component Value Date   HGBA1C 6.9 (A) 03/13/2018   HGBA1C 14.3 12/06/2017   HGBA1C 8.2 10/28/2016   HGBA1C 6.4 06/14/2016   HGBA1C >15 02/25/2016   HGBA1C 6.8 (H) 12/10/2014   HGBA1C 7.2 (H) 09/10/2014   HGBA1C >14.0 04/12/2014   HGBA1C >14.0 11/15/2013   HGBA1C 6.0 05/02/2013   HGBA1C 5.4 01/17/2013   HGBA1C 7.6 10/12/2012   HGBA1C =>14.0 07/12/2012  She was on steroids for back pain in 09-08/2014: Prednisone taper pack, then injections in back. Sugars spiked then. She was taken off her diabetes meds in 2014 as her A1c was great >> sugars got out of control (in the 400s).  Pt is on a regimen of: - Januvia 100 mg in am - Metformin XR 1000 mg 2x a day - Basaglar 10 units at bedtime She stopped Glipizide  And Glipizide 5 mg bid b/c of low CBGs. She had problems with Metformin - tried this in 2011: abdominal cramps and diarrhea.   Pt checks her sugars 2-3 times a day: - am: 130-132  (on insulin),  250-398 (off insulin)  >> 80s-90 >> (3:30 am) 118-122, 148 (URI) - 2.5 h after b'fast: <100, 160 >> 89, 90 >> n/c >> 100-112 - before lunch:315-375 >> 80-90s >> 93 >> n/c - 2h after lunch: <100 >> n/c >> 80-90s  >> 110-120 - before dinner: 100 >> 84-117, 131 >> n/c  - 2h after dinner: n/c >> 115-118 >> n/c  - bedtime: n/c >> 340-350 >> 90-97 She had lows before stopping Glipizide (34, 58) >> since then: 72 >> 80 >> 52 (increased activity, did not eat); she has hypoglycemia awareness in the 60s. Highest sugar was 151 x1 >> 398 >> 90s >> 148.  Pt's meals are: - Breakfast: yoghurt or bowl of grits/coffee/banana - Lunch + Dinner: grilled chicken salad - Snacks: veggies, bananas,  oranges, crackers; no sodas  -No CKD, last BUN/creatinine:  Lab Results  Component Value Date   BUN 12 03/13/2018   CREATININE 0.79 03/13/2018  She is not on an ACE inhibitor/ARB. -+ Dyslipidemia; last set of lipids: Lab Results  Component Value Date   CHOL 129 03/13/2018   HDL 39.10 03/13/2018   LDLCALC 69 03/13/2018   TRIG 105.0 03/13/2018   CHOLHDL 3 03/13/2018  Not on a statin. - Latest eye exam 11/2017: No DR -+ Numbness, but no tingling in her feet.  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  She commutes to [place] daily!  ROS: Constitutional: + weight gain/no weight loss, no fatigue, no subjective hyperthermia, no subjective hypothermia Eyes: no blurry vision, no xerophthalmia ENT: no sore throat, no nodules palpated in neck, no dysphagia, no odynophagia, no hoarseness Cardiovascular: no CP/no SOB/no palpitations/no leg swelling Respiratory: no cough/no SOB/no wheezing Gastrointestinal: no N/no V/no D/no C/+ acid reflux Musculoskeletal: no muscle aches/no joint aches Skin: no rashes, no hair loss Neurological: no tremors/no numbness/no tingling/no dizziness  I reviewed pt's medications, allergies, PMH, social hx, family hx, and changes were documented in the history of present illness. Otherwise, unchanged from my initial visit note.  Past Medical History:  Diagnosis  Date  . Depression   . Diabetes mellitus without complication (HCC)   . Low back pain   . Numbness and tingling of left leg    Past Surgical History:  Procedure Laterality Date  . ABDOMINAL HYSTERECTOMY  2005   ADENOMYOSIS; ovaries resected.  . CRYOABLATION  2004  . LAPAROSCOPIC ABDOMINAL EXPLORATION    . UPPER GASTROINTESTINAL ENDOSCOPY     Social History   Socioeconomic History  . Marital status: Married    Spouse name: [name]  . Number of children: 3  . Years of education: 12+  . Highest education level: Not on file   Occupational History  . Occupation: CUSTOMER SERVICE SUPERVISOR    Employer: [employer]  Social Needs  . Financial resource strain: Not on file  . Food insecurity:    Worry: Not on file    Inability: Not on file  . Transportation needs:    Medical: Not on file    Non-medical: Not on file  Tobacco Use  . Smoking status: Former Smoker    Years: 26.00    Types: Cigarettes    Last attempt to quit: 04/03/2008    Years since quitting: 10.3  . Smokeless tobacco: Never Used  Substance and Sexual Activity  . Alcohol use: Yes    Alcohol/week: 0.0 standard drinks    Comment: Occasionally  . Drug use: No  . Sexual activity: Yes    Partners: Male    Birth control/protection: Surgical  Lifestyle  . Physical activity:    Days per week: Not on file    Minutes per session: Not on file  . Stress: Not on file  Relationships  . Social connections:    Talks on phone: Not on file    Gets together: Not on file    Attends religious service: Not on file    Active member of club or organization: Not on file    Attends meetings of clubs or organizations: Not on file    Relationship status: Not on file  . Intimate partner violence:    Fear of current or ex partner: Not on file    Emotionally abused: Not on file    Physically abused: Not on file    Forced sexual activity: Not on file  Other Topics Concern  . Not on file  Social History Narrative   Lives at home with husband.    Three sons are grown and live independently.    Right-handed.   2 cups caffeine/day.   Current Outpatient Medications on File Prior to Visit  Medication Sig Dispense Refill  . Insulin Glargine (BASAGLAR KWIKPEN) 100 UNIT/ML SOPN Inject 0.1 mLs (10 Units total) into the skin at bedtime. 15 mL 3  . Insulin Pen Needle (FIFTY50 PEN NEEDLES) 32G X 4 MM MISC Use 1x a day 100 each 3  . metFORMIN (GLUCOPHAGE-XR) 500 MG 24 hr tablet TAKE 2 TABLETS TWICE A DAY 360 tablet 4  . ONE TOUCH ULTRA TEST test strip USE 3 TIMES  A DAY 300 each 3  . sitaGLIPtin (JANUVIA) 100 MG tablet TAKE 1 TABLET DAILY 90 tablet 4   No current facility-administered medications on file prior to visit.    Allergies  Allergen Reactions  . Metformin And Related     diarrhea  . Adhesive [Tape] Rash   Family History  Problem Relation Age of Onset  . Cancer Mother        Cervical s/p hysterectomy; mets to the abdomen  . Hypertension Mother   .  Cancer Father        prostate cancer    PE: BP 128/70   Pulse 87   Ht 5\' 4"  (1.626 m) Comment: measured  Wt 242 lb (109.8 kg)   SpO2 97%   BMI 41.54 kg/m  Body mass index is 41.54 kg/m.  Wt Readings from Last 3 Encounters:  07/28/18 242 lb (109.8 kg)  03/13/18 235 lb 6.4 oz (106.8 kg)  12/06/17 218 lb 3.2 oz (99 kg)   Constitutional: overweight, in NAD Eyes: PERRLA, EOMI, no exophthalmos ENT: moist mucous membranes, no thyromegaly, no cervical lymphadenopathy Cardiovascular: RRR, No MRG Respiratory: CTA B Gastrointestinal: abdomen soft, NT, ND, BS+ Musculoskeletal: no deformities, strength intact in all 4 Skin: moist, warm, no rashes Neurological: no tremor with outstretched hands, DTR normal in all 4  ASSESSMENT: 1. DM2, insulin-dependent, uncontrolled, without long-term complications, but with hyperglycemia  Component     Latest Ref Rng & Units 06/14/2016  Hemoglobin A1C      6.4  Pancreatic Islet Cell Antibody     <5 JDF Units <5  Glutamic Acid Decarb Ab     <5 IU/mL <5  C-Peptide     0.80 - 3.85 ng/mL 2.64  Glucose, Fasting     65 - 99 mg/dL 161 (H)   Labs consistent with type 2, rather than type 1 diabetes.  2. Obesity  3.  Dyslipidemia  PLAN:  1. Patient with long-standing, previously fairly well controlled type 2 diabetes, who returned this summer after a long absence with her HbA1c increasing dramatically from 8.2% to 14.3%.  At that time, we restarted Basaglar, which she took in the past but was able to come off.  We continued metformin and Januvia.   Her sugars improved to traumatically to an HbA1c of 6.9% at last visit.  At that time we decreased her Basaglar from 15 units to 10 units.  Her sugars were low after meals then.   - At this visit, sugars are excellent, with the exception of last week, when she has been fighting a URI.  For this last week, sugars in the morning have been in the 140s.  In the rest of the day, sugars are still at goal.  - We can decrease the dose of Basaglar further in the light of his excellent blood sugars but I advised her to do so after resolution of her URI.  In the meantime, I also would like to check her for type 1 diabetes again to make sure we can start Basaglar.  I advised her that if the tests are normal, we can try to stop insulin. - I advised her to:  Patient Instructions  Please continue: - Metformin ER 1000 mg 2x a day - Januvia 100 mg daily in am  Please decrease: - Basaglar 10 >> 6 units at bedtime  Please return in 4 months with your sugar log.      - today, HbA1c is 6% (better) - continue checking sugars at different times of the day - check 1x a day, rotating checks - advised for yearly eye exams >> she is UTD - UTD with flu shot - Return to clinic in 4 mo with sugar log     2. Obesity -We decreased the Basaglar at last visit and will decrease the dose more at this visit.  If she does not have insulin deficiency, we can even stop Basaglar completely -She previously gained weight (17 pounds) after started insulin, but she had a previous 12  pound loss due to glucose toxicity.  Since last visit, however, she gained 7 pounds. -We can try Trulicity in the future if needed, and this should also help with weight loss  3. Dyslipidemia  - Reviewed latest lipid panel from 03/2018: All fractions at goal Lab Results  Component Value Date   CHOL 129 03/13/2018   HDL 39.10 03/13/2018   LDLCALC 69 03/13/2018   TRIG 105.0 03/13/2018   CHOLHDL 3 03/13/2018  -She is not on a statin.  Component      Latest Ref Rng & Units 07/28/2018  Glutamic Acid Decarb Ab     <5 IU/mL <5  C-Peptide     0.80 - 3.85 ng/mL 1.36  Glucose, Plasma     65 - 99 mg/dL 621 (H)  ZNT8 Antibodies     U/mL <15  Islet Cell Ab     Neg:<1:1 Negative   Good news: No signs of type 1 diabetes.  Carlus Pavlov, MD PhD [facility] Endocrinology

## 2018-07-28 NOTE — Patient Instructions (Addendum)
Please continue: - Metformin ER 1000 mg 2x a day - Januvia 100 mg daily in am  Please decrease: - Basaglar 10 >> 6 units at bedtime  Please return in 4 months with your sugar log.

## 2018-07-31 LAB — GLUCOSE, FASTING: GLUCOSE, PLASMA: 100 mg/dL — AB (ref 65–99)

## 2018-07-31 LAB — GLUTAMIC ACID DECARBOXYLASE AUTO ABS: Glutamic Acid Decarb Ab: <5 IU/mL (ref <5)

## 2018-07-31 LAB — C-PEPTIDE: C-Peptide: 1.36 ng/mL (ref 0.80–3.85)

## 2018-08-01 LAB — ANTI-ISLET CELL ANTIBODY: ISLET CELL AB: NEGATIVE

## 2018-08-05 LAB — ZNT8 ANTIBODIES: ZNT8 Antibodies: <15 U/mL

## 2018-11-30 ENCOUNTER — Ambulatory Visit: Payer: BLUE CROSS/BLUE SHIELD | Admitting: Internal Medicine

## 2018-11-30 ENCOUNTER — Encounter: Payer: Self-pay | Admitting: Internal Medicine

## 2018-11-30 ENCOUNTER — Other Ambulatory Visit: Payer: Self-pay

## 2018-11-30 VITALS — BP 130/70 | HR 82 | Ht 64.0 in | Wt 253.0 lb

## 2018-11-30 DIAGNOSIS — E785 Hyperlipidemia, unspecified: Secondary | ICD-10-CM

## 2018-11-30 DIAGNOSIS — Z6837 Body mass index (BMI) 37.0-37.9, adult: Secondary | ICD-10-CM

## 2018-11-30 LAB — POCT GLYCOSYLATED HEMOGLOBIN (HGB A1C): Hemoglobin A1C: 6.4 % — AB (ref 4.0–5.6)

## 2018-11-30 MED ORDER — ONETOUCH ULTRASOFT LANCETS MISC: 3 refills | Status: DC

## 2018-11-30 NOTE — Patient Instructions (Signed)
Please  continue: - Metformin ER  1000 mg 2x a day - Januvia  100 mg daily in am  Please stop: - Basaglar   Please return in 4 months with your sugar log.

## 2018-11-30 NOTE — Progress Notes (Signed)
Patient ID: Kelly Simon, female   DOB: 11-26-1965, 53 y.o.   MRN: [id number]  HPI: Kelly Simon is a 53 y.o.-year-old female, returning for f/u for DM2, dx 2006, insulin-dependent, uncontrolled, without long-term complications. Last visit 4 months ago.  At last visit, we checked her again for type 1 diabetes and the investigation was negative.  Last hemoglobin A1c was: Lab Results  Component Value Date   HGBA1C 6.0 (A) 07/28/2018   HGBA1C 6.9 (A) 03/13/2018   HGBA1C 14.3 12/06/2017   HGBA1C 8.2 10/28/2016   HGBA1C 6.4 06/14/2016   HGBA1C >15 02/25/2016   HGBA1C 6.8 (H) 12/10/2014   HGBA1C 7.2 (H) 09/10/2014   HGBA1C >14.0 04/12/2014   HGBA1C >14.0 11/15/2013   HGBA1C 6.0 05/02/2013   HGBA1C 5.4 01/17/2013   HGBA1C 7.6 10/12/2012   HGBA1C =>14.0 07/12/2012  She was on steroids for back pain in 09-08/2014: Prednisone taper pack, then injections in back. Sugars spiked then. She was taken off her diabetes meds in 2014 as her A1c was great >> but then sugars got out of control, in the 400s..  Pt is on a regimen of: - Metformin ER  1000 mg 2x a day - Januvia  100 mg daily in am - Basaglar 6 units at bedtime She stopped Glipizide  And Glipizide 5 mg bid b/c of low CBGs. She had problems with Metformin - tried this in 2011: abdominal cramps and diarrhea.   Pt checks her sugars 2-3 times a day: - am: 80s-90 >> (3:30 am) 118-122, 148 (URI) >> 110-112 off basaglar and 80s-90s with it - 2.5 h after b'fast:  89, 90 >> n/c >> 100-112 >> n/c - before lunch: 80-90s >> 93 >> n/c >>  100 - 2h after lunch:  80-90s  >> 110-120 >> n/c - before dinner: 100 >> 84-117, 131 >> n/c >> (after snack): 99-100 - 2h after dinner: n/c >> 115-118 >> n/c  - bedtime: n/c >> 340-350 >> 90-97 >> 99-100 She had lows before stopping Glipizide (34, 58) >> since then: 52 (increased activity, did not eat) >> 76; she has hypoglycemia awareness in the 60s. Highest sugar was 148 >> 115.  Pt's meals  are: - Breakfast: yoghurt or bowl of grits/coffee/banana - Lunch + Dinner: grilled chicken salad - Snacks: veggies, bananas, oranges, crackers; no sodas  -No, last BUN/creatinine:  Lab Results  Component Value Date   BUN 12 03/13/2018   CREATININE 0.79 03/13/2018  She is not on an ACE inhibitor/ARB. -+ Dyslipidemia; last set of lipids: Lab Results  Component Value Date   CHOL 129 03/13/2018   HDL 39.10 03/13/2018   LDLCALC 69 03/13/2018   TRIG 105.0 03/13/2018   CHOLHDL 3 03/13/2018  She is not on a statin. - Latest eye exam 11/2017: No DR -+ Numbness, but no tingling in her feet.  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  She commutes to [place] daily!  ROS: Constitutional: + weight gain/no weight loss, no fatigue, no subjective hyperthermia, no subjective hypothermia Eyes: no blurry vision, no xerophthalmia ENT: no sore throat, no nodules palpated in neck, no dysphagia, no odynophagia, no hoarseness Cardiovascular: no CP/no SOB/no palpitations/no leg swelling Respiratory: no cough/no SOB/no wheezing Gastrointestinal: no N/no V/no D/no C/no acid reflux Musculoskeletal: no muscle aches/no joint aches Skin: no rashes, no hair loss Neurological: no tremors/no numbness/no tingling/no dizziness  I reviewed pt's medications, allergies, PMH, social  hx, family hx, and changes were documented in the history of present illness. Otherwise, unchanged from my initial visit note.  Past Medical History:  Diagnosis Date  . Depression   . Diabetes mellitus without complication (HCC)   . Low back pain   . Numbness and tingling of left leg    Past Surgical History:  Procedure Laterality Date  . ABDOMINAL HYSTERECTOMY  2005   ADENOMYOSIS; ovaries resected.  . CRYOABLATION  2004  . LAPAROSCOPIC ABDOMINAL EXPLORATION    . UPPER GASTROINTESTINAL ENDOSCOPY     Social History   Socioeconomic History  . Marital status:  Married    Spouse name: Kelly Simon  . Number of children: 3  . Years of education: 12+  . Highest education level: Not on file  Occupational History  . Occupation: CUSTOMER SERVICE SUPERVISOR    Employer: [employer]  Social Needs  . Financial resource strain: Not on file  . Food insecurity:    Worry: Not on file    Inability: Not on file  . Transportation needs:    Medical: Not on file    Non-medical: Not on file  Tobacco Use  . Smoking status: Former Smoker    Years: 26.00    Types: Cigarettes    Last attempt to quit: 04/03/2008    Years since quitting: 10.6  . Smokeless tobacco: Never Used  Substance and Sexual Activity  . Alcohol use: Yes    Alcohol/week: 0.0 standard drinks    Comment: Occasionally  . Drug use: No  . Sexual activity: Yes    Partners: Male    Birth control/protection: Surgical  Lifestyle  . Physical activity:    Days per week: Not on file    Minutes per session: Not on file  . Stress: Not on file  Relationships  . Social connections:    Talks on phone: Not on file    Gets together: Not on file    Attends religious service: Not on file    Active member of club or organization: Not on file    Attends meetings of clubs or organizations: Not on file    Relationship status: Not on file  . Intimate partner violence:    Fear of current or ex partner: Not on file    Emotionally abused: Not on file    Physically abused: Not on file    Forced sexual activity: Not on file  Other Topics Concern  . Not on file  Social History Narrative   Lives at home with husband.    Three sons are grown and live independently.    Right-handed.   2 cups caffeine/day.   Current Outpatient Medications on File Prior to Visit  Medication Sig Dispense Refill  . Insulin Glargine (BASAGLAR KWIKPEN) 100 UNIT/ML SOPN Inject 0.06 mLs (6 Units total) into the skin at bedtime. 15 mL 3  . Insulin Pen Needle (FIFTY50 PEN NEEDLES) 32G X 4 MM MISC Use 1x a day 100 each 3  .  metFORMIN (GLUCOPHAGE-XR) 500 MG 24 hr tablet TAKE 2 TABLETS TWICE A DAY 360 tablet 4  . ONE TOUCH ULTRA TEST test strip USE 3 TIMES A DAY 300 each 3  . sitaGLIPtin (JANUVIA) 100 MG tablet TAKE 1 TABLET DAILY 90 tablet 4   No current facility-administered medications on file prior to visit.    Allergies  Allergen Reactions  . Metformin And Related     diarrhea  . Adhesive [Tape] Rash   Family History  Problem Relation  Age of Onset  . Cancer Mother        Cervical s/p hysterectomy; mets to the abdomen  . Hypertension Mother   . Cancer Father        prostate cancer    PE: BP 130/70   Pulse 82   Ht 5\' 4"  (1.626 m)   Wt 253 lb (114.8 kg)   SpO2 98%   BMI 43.43 kg/m  Body mass index is 43.43 kg/m.  Wt Readings from Last 3 Encounters:  11/30/18 253 lb (114.8 kg)  07/28/18 242 lb (109.8 kg)  03/13/18 235 lb 6.4 oz (106.8 kg)   Constitutional: overweight, in NAD Eyes: PERRLA, EOMI, no exophthalmos ENT: moist mucous membranes, no thyromegaly, no cervical lymphadenopathy Cardiovascular: RRR, No MRG Respiratory: CTA B Gastrointestinal: abdomen soft, NT, ND, BS+ Musculoskeletal: no deformities, strength intact in all 4 Skin: moist, warm, no rashes Neurological: no tremor with outstretched hands, DTR normal in all 4  ASSESSMENT: 1. DM2, insulin-dependent, uncontrolled, without long-term complications, but with hyperglycemia  Component     Latest Ref Rng & Units 06/14/2016  Hemoglobin A1C      6.4  Pancreatic Islet Cell Antibody     <5 JDF Units <5  Glutamic Acid Decarb Ab     <5 IU/mL <5  C-Peptide     0.80 - 3.85 ng/mL 2.64  Glucose, Fasting     65 - 99 mg/dL 100 (H)  Labs consistent with type 2, rather than type 1 diabetes.  Component     Latest Ref Rng & Units 07/28/2018  Glutamic Acid Decarb Ab     <5 IU/mL <5  C-Peptide     0.80 - 3.85 ng/mL 1.36  Glucose, Plasma     65 - 99 mg/dL 712 (H)  ZNT8 Antibodies     U/mL <15  Islet Cell Ab     Neg:<1:1  Negative  No signs of type 1 diabetes.  2. Obesity  3.  Dyslipidemia  PLAN:  1. Patient with longstanding, now fairly well-controlled type 2 diabetes, with sugars usually at goal, except for right before last visit when she has been fighting a URI.  Her HbA1c at last visit was 6.0%.  At that time we checked her for type 1 diabetes and the investigation was negative.  We decreased her Basaglar dose and I advised her that since she had good insulin production, she could try to stop insulin. She mentions sugars were a little higher in am when she tried to stop (110-112, so she restarted it. Now 80's-90s. - sugars at home are all at goal per her recall >> will stop insulin - I advised her to:  Patient Instructions  Please  continue: - Metformin ER  1000 mg 2x a day - Januvia  100 mg daily in am  Please stop: - Basaglar   Please return in 4 months with your sugar log.      - today, HbA1c is 6.4% (slightly higher) - continue checking sugars at different times of the day - check 1x a day, rotating checks - advised for yearly eye exams >> she is UTD, new exam coming up next week - Return to clinic in 4 mo with sugar log      2. Obesity - She gained a significant amount of wt after starting insulin! - almost 20 lbs since last summer -We decrease Basaglar at the last 2 visits.  Since she had no insulin deficiency, we can trytry to stop Basaglar completely.  This can also help with weight loss.   -In the future, we can also switch from Januvia to Trulicity to help with weight loss even more.  3. Dyslipidemia  - Reviewed latest lipid panel from 03/2018: Fractions at goal Lab Results  Component Value Date   CHOL 129 03/13/2018   HDL 39.10 03/13/2018   LDLCALC 69 03/13/2018   TRIG 105.0 03/13/2018   CHOLHDL 3 03/13/2018  -She is not on a statin.  Carlus Pavlov, MD PhD [facility] Endocrin[facility]

## 2018-11-30 NOTE — Addendum Note (Signed)
Addended by: Darliss Ridgel I on: 11/30/2018 08:54 AM   Modules accepted: Orders

## 2018-12-16 DIAGNOSIS — E119 Type 2 diabetes mellitus without complications: Secondary | ICD-10-CM | POA: Diagnosis not present

## 2018-12-16 LAB — HM DIABETES EYE EXAM

## 2019-01-07 ENCOUNTER — Other Ambulatory Visit: Payer: Self-pay | Admitting: Internal Medicine

## 2019-03-29 ENCOUNTER — Other Ambulatory Visit: Payer: Self-pay

## 2019-04-02 ENCOUNTER — Ambulatory Visit (INDEPENDENT_AMBULATORY_CARE_PROVIDER_SITE_OTHER): Payer: BLUE CROSS/BLUE SHIELD | Admitting: Internal Medicine

## 2019-04-02 ENCOUNTER — Encounter: Payer: Self-pay | Admitting: Internal Medicine

## 2019-04-02 ENCOUNTER — Other Ambulatory Visit: Payer: Self-pay

## 2019-04-02 VITALS — BP 122/70 | HR 90 | Ht 64.0 in | Wt 246.0 lb

## 2019-04-02 DIAGNOSIS — E1165 Type 2 diabetes mellitus with hyperglycemia: Secondary | ICD-10-CM

## 2019-04-02 DIAGNOSIS — E785 Hyperlipidemia, unspecified: Secondary | ICD-10-CM | POA: Diagnosis not present

## 2019-04-02 DIAGNOSIS — Z6837 Body mass index (BMI) 37.0-37.9, adult: Secondary | ICD-10-CM | POA: Diagnosis not present

## 2019-04-02 DIAGNOSIS — IMO0001 Reserved for inherently not codable concepts without codable children: Secondary | ICD-10-CM

## 2019-04-02 LAB — POCT GLYCOSYLATED HEMOGLOBIN (HGB A1C): Hemoglobin A1C: 6.6 % — AB (ref 4.0–5.6)

## 2019-04-02 NOTE — Patient Instructions (Addendum)
Please continue: - Metformin ER 1000 mg 2x a day  Try to stop: - Januvia   Please return in 4 months with your sugar log.

## 2019-04-02 NOTE — Progress Notes (Signed)
Patient ID: Kelly Simon, female   DOB: [birth year], 53 y.o.   MRN: [id number]  HPI: Kelly Simon is a 53 y.o.-year-old female, returning for f/u for DM2, dx 2006, insulin-dependent, uncontrolled, without long-term complications. Last visit  4 months ago.  She usually commutes to [place] every day, but now working from home during the coronavirus pandemic.  Since she stopped commuting, her sugars improved.  Last hemoglobin A1c was: Lab Results  Component Value Date   HGBA1C 6.4 (A) 11/30/2018   HGBA1C 6.0 (A) 07/28/2018   HGBA1C 6.9 (A) 03/13/2018   HGBA1C 14.3 12/06/2017   HGBA1C 8.2 10/28/2016   HGBA1C 6.4 06/14/2016   HGBA1C >15 02/25/2016   HGBA1C 6.8 (H) 12/10/2014   HGBA1C 7.2 (H) 09/10/2014   HGBA1C >14.0 04/12/2014   HGBA1C >14.0 11/15/2013   HGBA1C 6.0 05/02/2013   HGBA1C 5.4 01/17/2013   HGBA1C 7.6 10/12/2012   HGBA1C =>14.0 07/12/2012  She was on steroids for back pain in 09-08/2014: Prednisone taper pack, then injections in back. Sugars spiked then. She was taken off her diabetes meds in 2014 as her A1c was great >> but then sugars got out of control, in the 400s..  Pt is on a regimen of: - Metformin ER 1000 mg 2x a day - Januvia  100 mg daily in am -  >> stopped 11/2018 due to very well controlled blood sugars She stopped Glipizide  And Glipizide 5 mg bid b/c of low CBGs. She had problems with Metformin - tried this in 2011: abdominal cramps and diarrhea.   Pt checks her sugars 2-3 times a day: - am: 118-122, 148 (URI) >> 110-112 off basaglar and 80s-90s with it >> 71-82, 112 - 2.5 h after b'fast:  89, 90 >> n/c >> 100-112 >> n/c - before lunch: 80-90s >> 93 >> n/c >>  100 >> n/c - 2h after lunch:  80-90s  >> 110-120 >> n/c >> 90s - before dinner:  84-117, 131 >> n/c >> (after snack): 99-100 >> 90s - 2h after dinner: n/c >> 115-118 >> n/c  >> <100 - bedtime: n/c >> 340-350 >> 90-97 >> 99-100 >> <89 She had lows before stopping Glipizide (34, 58)  >> since then: 52 (increased activity, did not eat) >> 76 >> 71; she has hypoglycemia awareness in the 60s. Highest sugar was 148 >> 115 >> 119.  Pt's meals are: - Breakfast: yoghurt or bowl of grits/coffee/banana - Lunch + Dinner: grilled chicken salad - Snacks: veggies, bananas, oranges, crackers; no sodas  -No CKD: BUN/creatinine:  Lab Results  Component Value Date   BUN 12 03/13/2018   CREATININE 0.79 03/13/2018  She is not on an ACE inhibitor/ARB. -+ Dyslipidemia; last set of lipids: Lab Results  Component Value Date   CHOL 129 03/13/2018   HDL 39.10 03/13/2018   LDLCALC 69 03/13/2018   TRIG 105.0 03/13/2018   CHOLHDL 3 03/13/2018  She is not on a statin. She has an appointment coming up with PCP next month. - Latest eye exam 11/2018: No DR -+  Numbness, but no tingling in her feet.  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  She commutes to [place] daily!  ROS: Constitutional: no weight gain/+ weight loss, no fatigue, no subjective hyperthermia, no subjective hypothermia Eyes: no blurry vision, no xerophthalmia ENT: no sore throat, no nodules palpated in neck, no dysphagia, no odynophagia, no hoarseness Cardiovascular: no CP/no  SOB/no palpitations/no leg swelling Respiratory: no cough/no SOB/no wheezing Gastrointestinal: no N/no V/no D/no C/no acid reflux Musculoskeletal: no muscle aches/no joint aches Skin: no rashes, no hair loss Neurological: no tremors/no numbness/no tingling/no dizziness  I reviewed pt's medications, allergies, PMH, social hx, family hx, and changes were documented in the history of present illness. Otherwise, unchanged from my initial visit note.  Past Medical History:  Diagnosis Date  . Depression   . Diabetes mellitus without complication (HCC)   . Low back pain   . Numbness and tingling of left leg    Past Surgical History:  Procedure Laterality Date  . ABDOMINAL  HYSTERECTOMY  2005   ADENOMYOSIS; ovaries resected.  . CRYOABLATION  2004  . LAPAROSCOPIC ABDOMINAL EXPLORATION    . UPPER GASTROINTESTINAL ENDOSCOPY     Social History   Socioeconomic History  . Marital status: Married    Spouse name: Cristal DeerChristopher  . Number of children: 3  . Years of education: 12+  . Highest education level: Not on file  Occupational History  . Occupation: CUSTOMER SERVICE SUPERVISOR    Employer: [employer]  Social Needs  . Financial resource strain: Not on file  . Food insecurity    Worry: Not on file    Inability: Not on file  . Transportation needs    Medical: Not on file    Non-medical: Not on file  Tobacco Use  . Smoking status: Former Smoker    Years: 26.00    Types: Cigarettes    Quit date: 04/03/2008    Years since quitting: 11.0  . Smokeless tobacco: Never Used  Substance and Sexual Activity  . Alcohol use: Yes    Alcohol/week: 0.0 standard drinks    Comment: Occasionally  . Drug use: No  . Sexual activity: Yes    Partners: Male    Birth control/protection: Surgical  Lifestyle  . Physical activity    Days per week: Not on file    Minutes per session: Not on file  . Stress: Not on file  Relationships  . Social Musicianconnections    Talks on phone: Not on file    Gets together: Not on file    Attends religious service: Not on file    Active member of club or organization: Not on file    Attends meetings of clubs or organizations: Not on file    Relationship status: Not on file  . Intimate partner violence    Fear of current or ex partner: Not on file    Emotionally abused: Not on file    Physically abused: Not on file    Forced sexual activity: Not on file  Other Topics Concern  . Not on file  Social History Narrative   Lives at home with husband.    Three sons are grown and live independently.    Right-handed.   2 cups caffeine/day.   Current Outpatient Medications on File Prior to Visit  Medication Sig Dispense Refill  .  Lancets (ONETOUCH ULTRASOFT) lancets Use as instructed 2x a day - for One Touch Ultr meter 200 each 3  . metFORMIN (GLUCOPHAGE-XR) 500 MG 24 hr tablet TAKE 2 TABLETS TWICE A DAY 360 tablet 4  . ONE TOUCH ULTRA TEST test strip USE 3 TIMES A DAY 300 each 3   No current facility-administered medications on file prior to visit.    Allergies  Allergen Reactions  . Metformin And Related     diarrhea  . Adhesive [Tape] Rash  Family History  Problem Relation Age of Onset  . Cancer Mother        Cervical s/p hysterectomy; mets to the abdomen  . Hypertension Mother   . Cancer Father        prostate cancer    PE: BP 122/70   Pulse 90   Ht 5\' 4"  (1.626 m)   Wt 246 lb (111.6 kg)   SpO2 98%   BMI 42.23 kg/m  Body mass index is 42.23 kg/m.  Wt Readings from Last 3 Encounters:  04/02/19 246 lb (111.6 kg)  11/30/18 253 lb (114.8 kg)  07/28/18 242 lb (109.8 kg)   Constitutional: overweight, in NAD Eyes: PERRLA, EOMI, no exophthalmos ENT: moist mucous membranes, no thyromegaly, no cervical lymphadenopathy Cardiovascular: RRR, No MRG Respiratory: CTA B Gastrointestinal: abdomen soft, NT, ND, BS+ Musculoskeletal: no deformities, strength intact in all 4 Skin: moist, warm, no rashes Neurological: no tremor with outstretched hands, DTR normal in all 4  ASSESSMENT: 1. DM2, insulin-dependent, uncontrolled, without long-term complications, but with hyperglycemia  Component     Latest Ref Rng & Units 06/14/2016  Hemoglobin A1C      6.4  Pancreatic Islet Cell Antibody     <5 JDF Units <5  Glutamic Acid Decarb Ab     <5 IU/mL <5  C-Peptide     0.80 - 3.85 ng/mL 2.64  Glucose, Fasting     65 - 99 mg/dL 696107 (H)  Labs consistent with type 2, rather than type 1 diabetes.  Component     Latest Ref Rng & Units 07/28/2018  Glutamic Acid Decarb Ab     <5 IU/mL <5  C-Peptide     0.80 - 3.85 ng/mL 1.36  Glucose, Plasma     65 - 99 mg/dL 295100 (H)  ZNT8 Antibodies     U/mL <15  Islet  Cell Ab     Neg:<1:1 Negative  No signs of type 1 diabetes.  2. Obesity  3.  Dyslipidemia  PLAN:  1. Patient with longstanding, now fairly well-controlled type 2 diabetes, with improved blood sugars at last visit to the point of mild lows.  At that time, I advised her to stop her Basaglar.  We checked her for type 1 diabetes twice and investigation was negative. -At this visit, sugars remain very well controlled, with the highest values lower than 120 after she eats a larger/later dinner.  For now, since her sugars are excellent, we discussed about the possibility of stopping Januvia.  I advised her to keep an eye on her sugars and may need to restart if they worsen. - I advised her to:  Patient Instructions  Please continue: - Metformin ER 1000 mg 2x a day  Try to stop: - Januvia   Please return in 4 months with your sugar log.      - today, HbA1c is 6.6% (slightly higher - but the measured HbA1c is discrepant with sugars at home, based on the review of her log, HbA1c should have been better) - continue checking sugars at different times of the day - check 1x a day, rotating checks - advised for yearly eye exams >> she is UTD - Return to clinic in 4 mo with sugar log   2. Obesity -She gained a significant amount of weight after starting insulin (almost 20 pounds).  However, her sugars improved and we were able to stop Basaglar at last visit. - lost 7 lbs since last OV! -Due to not commuting anymore and  also due to stopping insulin  3. Dyslipidemia  - Reviewed latest lipid panel from 03/2018: All fractions at goal Lab Results  Component Value Date   CHOL 129 03/13/2018   HDL 39.10 03/13/2018   LDLCALC 69 03/13/2018   TRIG 105.0 03/13/2018   CHOLHDL 3 03/13/2018  -She is not on a statin -We will have annual lipid panel checked by PCP next month  Carlus Pavlovristina Brynnan Rodenbaugh, MD PhD [facility]

## 2019-04-02 NOTE — Addendum Note (Signed)
Addended by: Cardell Peach I on: 04/02/2019 10:27 AM   Modules accepted: Orders

## 2019-07-31 ENCOUNTER — Other Ambulatory Visit: Payer: Self-pay

## 2019-08-02 ENCOUNTER — Other Ambulatory Visit: Payer: Self-pay

## 2019-08-02 ENCOUNTER — Encounter: Payer: Self-pay | Admitting: Internal Medicine

## 2019-08-02 ENCOUNTER — Ambulatory Visit (INDEPENDENT_AMBULATORY_CARE_PROVIDER_SITE_OTHER): Payer: BLUE CROSS/BLUE SHIELD | Admitting: Internal Medicine

## 2019-08-02 VITALS — BP 132/70 | HR 74 | Ht 64.0 in | Wt 245.0 lb

## 2019-08-02 DIAGNOSIS — E785 Hyperlipidemia, unspecified: Secondary | ICD-10-CM | POA: Diagnosis not present

## 2019-08-02 DIAGNOSIS — E1165 Type 2 diabetes mellitus with hyperglycemia: Secondary | ICD-10-CM

## 2019-08-02 DIAGNOSIS — Z23 Encounter for immunization: Secondary | ICD-10-CM

## 2019-08-02 DIAGNOSIS — Z6837 Body mass index (BMI) 37.0-37.9, adult: Secondary | ICD-10-CM

## 2019-08-02 LAB — COMPLETE METABOLIC PANEL WITH GFR
AG Ratio: 1.8 (calc) (ref 1.0–2.5)
ALT: 10 U/L (ref 6–29)
AST: 11 U/L (ref 10–35)
Albumin: 4.4 g/dL (ref 3.6–5.1)
Alkaline phosphatase (APISO): 68 U/L (ref 37–153)
BUN: 13 mg/dL (ref 7–25)
CO2: 25 mmol/L (ref 20–32)
Calcium: 9.6 mg/dL (ref 8.6–10.4)
Chloride: 107 mmol/L (ref 98–110)
Creat: 0.82 mg/dL (ref 0.50–1.05)
GFR, Est African American: 95 mL/min/{1.73_m2} (ref >=60)
GFR, Est Non African American: 82 mL/min/{1.73_m2} (ref >=60)
Globulin: 2.5 g/dL (calc) (ref 1.9–3.7)
Glucose, Bld: 88 mg/dL (ref 65–99)
Potassium: 4.7 mmol/L (ref 3.5–5.3)
Sodium: 143 mmol/L (ref 135–146)
Total Bilirubin: 0.3 mg/dL (ref 0.2–1.2)
Total Protein: 6.9 g/dL (ref 6.1–8.1)

## 2019-08-02 LAB — LIPID PANEL
Cholesterol: 134 mg/dL (ref 0–200)
HDL: 43.1 mg/dL (ref >39.00)
LDL Cholesterol: 68 mg/dL (ref 0–99)
NonHDL: 90.57
Total CHOL/HDL Ratio: 3
Triglycerides: 114 mg/dL (ref 0.0–149.0)
VLDL: 22.8 mg/dL (ref 0.0–40.0)

## 2019-08-02 LAB — POCT GLYCOSYLATED HEMOGLOBIN (HGB A1C): Hemoglobin A1C: 5.7 % — AB (ref 4.0–5.6)

## 2019-08-02 LAB — MICROALBUMIN / CREATININE URINE RATIO
Creatinine,U: 102.4 mg/dL
Microalb Creat Ratio: 7.6 mg/g (ref 0.0–30.0)
Microalb, Ur: 7.8 mg/dL — ABNORMAL HIGH (ref 0.0–1.9)

## 2019-08-02 MED ORDER — GLUCOSE BLOOD VI STRP: ORAL_STRIP | 3 refills | Status: DC

## 2019-08-02 NOTE — Patient Instructions (Signed)
Please continue: - Metformin ER 1000 mg 2x a day   Please return in 4-6 months with your sugar log.

## 2019-08-02 NOTE — Progress Notes (Addendum)
Patient ID: Kelly Simon, female   DOB: Apr 17, 1966, 53 y.o.   MRN: [id number]  HPI: Kelly Simon is a 53 y.o.-year-old female, returning for f/u for DM2, dx 2006, previously insulin-dependent, now controlled without insulin, without long-term complications. Last visit 4 months ago.  Last hemoglobin A1c was: Lab Results  Component Value Date   HGBA1C 6.6 (A) 04/02/2019   HGBA1C 6.4 (A) 11/30/2018   HGBA1C 6.0 (A) 07/28/2018   HGBA1C 6.9 (A) 03/13/2018   HGBA1C 14.3 12/06/2017   HGBA1C 8.2 10/28/2016   HGBA1C 6.4 06/14/2016   HGBA1C >15 02/25/2016   HGBA1C 6.8 (H) 12/10/2014   HGBA1C 7.2 (H) 09/10/2014   HGBA1C >14.0 04/12/2014   HGBA1C >14.0 11/15/2013   HGBA1C 6.0 05/02/2013   HGBA1C 5.4 01/17/2013   HGBA1C 7.6 10/12/2012   HGBA1C =>14.0 07/12/2012  She was on steroids for back pain in 09-08/2014: Prednisone taper pack, then injections in back. Sugars spiked then. She was taken off her diabetes meds in 2014 as her A1c was great >> but then sugars got out of control, in the 400s..  Pt is on a regimen of: - Metformin ER 1000 mg 2x a day -tolerates this well -  >> stopped 03/2019 due to very well controlled blood sugars -  >> stopped 11/2018 due to very well controlled blood sugars She stopped Glipizide  And Glipizide 5 mg bid b/c of low CBGs. She had problems with Metformin - tried this in 2011: abdominal cramps and diarrhea.   Pt checks her sugars 1-3 times a day per review of her meter download: - am: 118-122, 148 (URI) >> 110-112 >> 71-82, 112 >> 73-98 - 2.5 h after b'fast:  89, 90 >> n/c >> 100-112 >> n/c - before lunch: 80-90s >> 93 >> n/c >>  100 >> n/c - 2h after lunch:  80-90s  >> 110-120 >> n/c >> 90s >> 90 - before dinner:  84-117, 131 >> n/c >> 99-100 >> 90s >> 90 - 2h after dinner: n/c >> 115-118 >> n/c  >> <100 >> 0 - bedtime: n/c >> 340-350 >> 90-97 >> 99-100 >> <89 >> 90s She had lows before stopping Glipizide (34, 58) >>... 71 >> 73; she has  hypoglycemia awareness in the 60s. Highest sugar was 119 >> 98.  Pt's meals are: - Breakfast: yoghurt or bowl of grits/coffee/banana - Lunch + Dinner: grilled chicken salad - Snacks: veggies, bananas, oranges, crackers; no sodas  -No CKD: BUN/creatinine:  Lab Results  Component Value Date   BUN 12 03/13/2018   CREATININE 0.79 03/13/2018  She is not on ACE inhibitor/ARB. -+ History of dyslipidemia; last set of lipids: Lab Results  Component Value Date   CHOL 129 03/13/2018   HDL 39.10 03/13/2018   LDLCALC 69 03/13/2018   TRIG 105.0 03/13/2018   CHOLHDL 3 03/13/2018  She is not on a statin. - Latest eye exam 11/2018: No DR -+  Numbness, but no tingling in her feet.  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  She used to commute to [place] daily!  Now working from home during the coronavirus pandemic  ROS: Constitutional: no weight gain/+ weight loss, no fatigue, no subjective hyperthermia, no subjective hypothermia Eyes: no blurry vision, no xerophthalmia ENT: no sore throat, no nodules palpated in neck, no dysphagia, no odynophagia, no hoarseness Cardiovascular: no CP/no SOB/no palpitations/no leg swelling Respiratory: no cough/no SOB/no wheezing  Gastrointestinal: no N/no V/no D/no C/no acid reflux Musculoskeletal: no muscle aches/no joint aches Skin: no rashes, no hair loss Neurological: no tremors/+ numbness/+ tingling/no dizziness  I reviewed pt's medications, allergies, PMH, social hx, family hx, and changes were documented in the history of present illness. Otherwise, unchanged from my initial visit note.  Past Medical History:  Diagnosis Date  . Depression   . Diabetes mellitus without complication (HCC)   . Low back pain   . Numbness and tingling of left leg    Past Surgical History:  Procedure Laterality Date  . ABDOMINAL HYSTERECTOMY  2005   ADENOMYOSIS; ovaries resected.  . CRYOABLATION  2004  .  LAPAROSCOPIC ABDOMINAL EXPLORATION    . UPPER GASTROINTESTINAL ENDOSCOPY     Social History   Socioeconomic History  . Marital status: Married    Spouse name: Kelly Simon  . Number of children: 3  . Years of education: 12+  . Highest education level: Not on file  Occupational History  . Occupation: CUSTOMER SERVICE SUPERVISOR    Employer: [employer]  Social Needs  . Financial resource strain: Not on file  . Food insecurity    Worry: Not on file    Inability: Not on file  . Transportation needs    Medical: Not on file    Non-medical: Not on file  Tobacco Use  . Smoking status: Former Smoker    Years: 26.00    Types: Cigarettes    Quit date: 04/03/2008    Years since quitting: 11.3  . Smokeless tobacco: Never Used  Substance and Sexual Activity  . Alcohol use: Yes    Alcohol/week: 0.0 standard drinks    Comment: Occasionally  . Drug use: No  . Sexual activity: Yes    Partners: Male    Birth control/protection: Surgical  Lifestyle  . Physical activity    Days per week: Not on file    Minutes per session: Not on file  . Stress: Not on file  Relationships  . Social Musician on phone: Not on file    Gets together: Not on file    Attends religious service: Not on file    Active member of club or organization: Not on file    Attends meetings of clubs or organizations: Not on file    Relationship status: Not on file  . Intimate partner violence    Fear of current or ex partner: Not on file    Emotionally abused: Not on file    Physically abused: Not on file    Forced sexual activity: Not on file  Other Topics Concern  . Not on file  Social History Narrative   Lives at home with husband.    Three sons are grown and live independently.    Right-handed.   2 cups caffeine/day.   Current Outpatient Medications on File Prior to Visit  Medication Sig Dispense Refill  . Lancets (ONETOUCH ULTRASOFT) lancets Use as instructed 2x a day - for One Touch Ultr  meter 200 each 3  . metFORMIN (GLUCOPHAGE-XR) 500 MG 24 hr tablet TAKE 2 TABLETS TWICE A DAY 360 tablet 4  . ONE TOUCH ULTRA TEST test strip USE 3 TIMES A DAY 300 each 3   No current facility-administered medications on file prior to visit.    Allergies  Allergen Reactions  . Metformin And Related     diarrhea  . Adhesive [Tape] Rash   Family History  Problem Relation Age of Onset  .  Cancer Mother        Cervical s/p hysterectomy; mets to the abdomen  . Hypertension Mother   . Cancer Father        prostate cancer   PE: BP 132/70 (BP Location: Left Arm, Patient Position: Sitting, Cuff Size: Large)   Pulse 74   Ht 5\' 4"  (1.626 m)   Wt 245 lb (111.1 kg)   SpO2 96%   BMI 42.05 kg/m  Body mass index is 42.05 kg/m.  Wt Readings from Last 3 Encounters:  08/02/19 245 lb (111.1 kg)  04/02/19 246 lb (111.6 kg)  11/30/18 253 lb (114.8 kg)   Constitutional: overweight, in NAD Eyes: PERRLA, EOMI, no exophthalmos ENT: moist mucous membranes, no thyromegaly, no cervical lymphadenopathy Cardiovascular: RRR, No MRG Respiratory: CTA B Gastrointestinal: abdomen soft, NT, ND, BS+ Musculoskeletal: no deformities, strength intact in all 4 Skin: moist, warm, no rashes Neurological: no tremor with outstretched hands, DTR normal in all 4  ASSESSMENT: 1. DM2, insulin-independent, now controlled, without long-term complications, but with hyperglycemia  Component     Latest Ref Rng & Units 06/14/2016  Hemoglobin A1C      6.4  Pancreatic Islet Cell Antibody     <5 JDF Units <5  Glutamic Acid Decarb Ab     <5 IU/mL <5  C-Peptide     0.80 - 3.85 ng/mL 2.64  Glucose, Fasting     65 - 99 mg/dL 107 (H)  Labs consistent with type 2, rather than type 1 diabetes.  Component     Latest Ref Rng & Units 07/28/2018  Glutamic Acid Decarb Ab     <5 IU/mL <5  C-Peptide     0.80 - 3.85 ng/mL 1.36  Glucose, Plasma     65 - 99 mg/dL 100 (H)  ZNT8 Antibodies     U/mL <15  Islet Cell Ab      Neg:<1:1 Negative  No signs of type 1 diabetes.  2. Obesity  3.  Dyslipidemia  PLAN:  1. Patient with longstanding, now well-controlled type 2 diabetes, with improved blood sugars in the last several months to the point of mild lows.  We stopped Basaglar and at last visit to stop Januvia.   -Of note, investigation for type 1 diabetes performed twice was negative. -At last visit sugars were excellent after she stopped commuting to [place] for work.  HbA1c was 6.6% then. -At this visit, sugars are perfect.  We discussed for now to continue the same dose of Metformin ER with the approaching holidays, but after the holidays, we can decrease the dose to half. - I advised her to:  Patient Instructions  Please continue: - Metformin ER 1000 mg 2x a day  Please return in 4-6 months with your sugar log.      - we checked her HbA1c: 5.7% (much better) - advised to check sugars at different times of the day - 1-2x a day, rotating check times - advised for yearly eye exams >> she is UTD - check annual labs today - + flu shot today - return to clinic in 4-6 months  2. Obesity -She gained a significant amount of weight after starting insulin (almost 20 pounds) however, her sugars improved and we were able to stop Basaglar before last visit >> she lost 7 pounds -Due to the coronavirus pandemic, she does not need to commute to [place] every day she was able to work on her diet and activity.  3. Dyslipidemia  -  reviewed latest  lipid panel from last year: All fractions at goal Lab Results  Component Value Date   CHOL 129 03/13/2018   HDL 39.10 03/13/2018   LDLCALC 69 03/13/2018   TRIG 105.0 03/13/2018   CHOLHDL 3 03/13/2018  -She is not on a statin -She is due for lipid panel-we will check today  Component     Latest Ref Rng & Units 08/02/2019  Glucose     65 - 99 mg/dL 88  BUN     7 - 25 mg/dL 13  Creatinine     1.610.50 - 1.05 mg/dL 0.960.82  GFR, Est Non African American     > OR =  60 mL/min/1.973m2 82  GFR, Est African American     > OR = 60 mL/min/1.3273m2 95  BUN/Creatinine Ratio     6 - 22 (calc) NOT APPLICABLE  Sodium     135 - 146 mmol/L 143  Potassium     3.5 - 5.3 mmol/L 4.7  Chloride     98 - 110 mmol/L 107  CO2     20 - 32 mmol/L 25  Calcium     8.6 - 10.4 mg/dL 9.6  Total Protein     6.1 - 8.1 g/dL 6.9  Albumin MSPROF     3.6 - 5.1 g/dL 4.4  Globulin     1.9 - 3.7 g/dL (calc) 2.5  AG Ratio     1.0 - 2.5 (calc) 1.8  Total Bilirubin     0.2 - 1.2 mg/dL 0.3  Alkaline phosphatase (APISO)     37 - 153 U/L 68  AST     10 - 35 U/L 11  ALT     6 - 29 U/L 10  Cholesterol     0 - 200 mg/dL 045134  Triglycerides     0.0 - 149.0 mg/dL 409.8114.0  HDL Cholesterol     >39.00 mg/dL 11.9143.10  VLDL     0.0 - 47.840.0 mg/dL 29.522.8  LDL (calc)     0 - 99 mg/dL 68  Total CHOL/HDL Ratio      3  NonHDL      90.57  Microalb, Ur     0.0 - 1.9 mg/dL 7.8 (H)  Creatinine,U     mg/dL [phone]  MICROALB/CREAT RATIO     0.0 - 30.0 mg/g 7.6   Labs are at goal.  Carlus Pavlovristina Khaleem Burchill, MD PhD [facility]

## 2019-08-02 NOTE — Addendum Note (Signed)
Addended by: Wyatt Haste F on: 08/02/2019 09:05 AM   Modules accepted: Orders

## 2019-10-07 ENCOUNTER — Other Ambulatory Visit: Payer: Self-pay | Admitting: Internal Medicine

## 2020-01-29 ENCOUNTER — Other Ambulatory Visit: Payer: Self-pay

## 2020-01-31 ENCOUNTER — Ambulatory Visit: Payer: BLUE CROSS/BLUE SHIELD | Admitting: Internal Medicine

## 2020-01-31 ENCOUNTER — Other Ambulatory Visit: Payer: Self-pay

## 2020-01-31 ENCOUNTER — Encounter: Payer: Self-pay | Admitting: Internal Medicine

## 2020-01-31 VITALS — BP 118/70 | HR 85 | Ht 64.0 in | Wt 231.0 lb

## 2020-01-31 DIAGNOSIS — Z6837 Body mass index (BMI) 37.0-37.9, adult: Secondary | ICD-10-CM | POA: Diagnosis not present

## 2020-01-31 DIAGNOSIS — E1165 Type 2 diabetes mellitus with hyperglycemia: Secondary | ICD-10-CM | POA: Diagnosis not present

## 2020-01-31 DIAGNOSIS — E785 Hyperlipidemia, unspecified: Secondary | ICD-10-CM

## 2020-01-31 LAB — POCT GLYCOSYLATED HEMOGLOBIN (HGB A1C): Hemoglobin A1C: 9.1 % — AB (ref 4.0–5.6)

## 2020-01-31 MED ORDER — FARXIGA 10 MG PO TABS: 10.0000 mg | ORAL_TABLET | Freq: Every day | ORAL | 11 refills | Status: DC

## 2020-01-31 NOTE — Patient Instructions (Addendum)
Please continue: - Metformin ER 1000 mg 2x a day  Please add: - Farxiga 5 mg daily before b'fast x1 week, then increase to 10 mg daily  Please return in 3-4 months with your sugar log.

## 2020-01-31 NOTE — Addendum Note (Signed)
Addended by: Darliss Ridgel I on: 01/31/2020 08:20 AM   Modules accepted: Orders

## 2020-01-31 NOTE — Progress Notes (Signed)
Patient ID: Kelly Simon, female   DOB: 1966-03-11, 54 y.o.   MRN: [id number]  This visit occurred during the SARS-CoV-2 public health emergency.  Safety protocols were in place, including screening questions prior to the visit, additional usage of staff PPE, and extensive cleaning of exam room while observing appropriate contact time as indicated for disinfecting solutions.   HPI: Kelly Simon is a 54 y.o.-year-old female, returning for f/u for DM2, dx 2006, previously insulin-dependent, now controlled without insulin, without long-term complications. Last visit 6 months ago.  Since last visit, she started to see much higher sugars at home (200s and up to 300s) which have improved in the last week.  There is no clear explanation for the increase in blood sugars.  She is not on any new p.o. medications or injectable steroids, she did not change her diet, did not introduce soft drinks, she did not return to work...  Reviewed HbA1c levels: Lab Results  Component Value Date   HGBA1C 5.7 (A) 08/02/2019   HGBA1C 6.6 (A) 04/02/2019   HGBA1C 6.4 (A) 11/30/2018   HGBA1C 6.0 (A) 07/28/2018   HGBA1C 6.9 (A) 03/13/2018   HGBA1C 14.3 12/06/2017   HGBA1C 8.2 10/28/2016   HGBA1C 6.4 06/14/2016   HGBA1C >15 02/25/2016   HGBA1C 6.8 (H) 12/10/2014   HGBA1C 7.2 (H) 09/10/2014   HGBA1C >14.0 04/12/2014   HGBA1C >14.0 11/15/2013   HGBA1C 6.0 05/02/2013   HGBA1C 5.4 01/17/2013   HGBA1C 7.6 10/12/2012   HGBA1C =>14.0 07/12/2012  She was on steroids for back pain in 09-08/2014: Prednisone taper pack, then injections in back. Sugars spiked then. She was taken off her diabetes meds in 2014 as her A1c was great >> but then sugars got out of control, in the 400s..  Pt is on a regimen of: - Metformin ER 1000 mg twice a day -  >> stopped 03/2019 due to very well controlled blood sugars -  >> stopped 11/2018 due to very well controlled blood sugars She stopped Glipizide  And Glipizide 5 mg bid  b/c of low CBGs. She had problems with Metformin - tried this in 2011: abdominal cramps and diarrhea.   Pt checks her sugars 1-3 times a day: - am:  110-112 >> 71-82, 112 >> 73-98 >> 112-222 (last week: 120-133) - 2.5 h after b'fast: n/c >> 100-112 >> n/c - before lunch:  93 >> n/c >>  100 >> n/c >> 112-213 (closer to 200; but last week: 160-170) - 2h after lunch:110-120 >> n/c >> 90s >> 90 >> n/c - before dinner: n/c >> 99-100 >> 90s >> 90 >> 89-124 - 2h after dinner:115-118 >> n/c  >> <100 >> n/c - bedtime: 340-350 >> 90-97 >> 99-100 >> <89 >> 90s >> n/c She had lows before stopping Glipizide (34, 58) >>... 71 >> 73 >> 89; she has hypoglycemia awareness in the 60s. Highest sugar was 119 >> 98 >> 300s.  Pt's meals are: - Breakfast: yoghurt or bowl of grits/coffee/banana - Lunch + Dinner: grilled chicken salad - Snacks: veggies, bananas, oranges, crackers; no sodas  -No CKD: BUN/creatinine:  Lab Results  Component Value Date   BUN 13 08/02/2019   CREATININE 0.82 08/02/2019  She is not on ACE inhibitor/ARB. -She has a history of dyslipidemia; last set of lipids: Lab Results  Component Value Date   CHOL 134 08/02/2019   HDL 43.10 08/02/2019   LDLCALC 68 08/02/2019   TRIG 114.0 08/02/2019   CHOLHDL 3  08/02/2019  She is not on a statin. - Latest eye exam 11/2018: No DR. Appt next week. 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  She used to commute to [place] daily!  Now working from home during the coronavirus pandemic  ROS: Constitutional: no weight gain/+ weight loss, no fatigue, no subjective hyperthermia, no subjective hypothermia Eyes: no blurry vision, no xerophthalmia ENT: no sore throat, no nodules palpated in neck, no dysphagia, no odynophagia, no hoarseness Cardiovascular: no CP/no SOB/no palpitations/no leg swelling Respiratory: no cough/no SOB/no  wheezing Gastrointestinal: no N/no V/no D/no C/no acid reflux Musculoskeletal: no muscle aches/no joint aches Skin: no rashes, no hair loss Neurological: no tremors/+ numbness/no tingling/no dizziness  I reviewed pt's medications, allergies, PMH, social hx, family hx, and changes were documented in the history of present illness. Otherwise, unchanged from my initial visit note.  Past Medical History:  Diagnosis Date  . Depression   . Diabetes mellitus without complication (HCC)   . Low back pain   . Numbness and tingling of left leg    Past Surgical History:  Procedure Laterality Date  . ABDOMINAL HYSTERECTOMY  2005   ADENOMYOSIS; ovaries resected.  . CRYOABLATION  2004  . LAPAROSCOPIC ABDOMINAL EXPLORATION    . UPPER GASTROINTESTINAL ENDOSCOPY     Social History   Socioeconomic History  . Marital status: Married    Spouse name: [name]  . Number of children: 3  . Years of education: 12+  . Highest education level: Not on file  Occupational History  . Occupation: CUSTOMER SERVICE SUPERVISOR    Employer: [employer]  Tobacco Use  . Smoking status: Former Smoker    Years: 26.00    Types: Cigarettes    Quit date: 04/03/2008    Years since quitting: 11.8  . Smokeless tobacco: Never Used  Substance and Sexual Activity  . Alcohol use: Yes    Alcohol/week: 0.0 standard drinks    Comment: Occasionally  . Drug use: No  . Sexual activity: Yes    Partners: Male    Birth control/protection: Surgical  Other Topics Concern  . Not on file  Social History Narrative   Lives at home with husband.    Three sons are grown and live independently.    Right-handed.   2 cups caffeine/day.   Social Determinants of Health   Financial Resource Strain:   . Difficulty of Paying Living Expenses:   Food Insecurity:   . Worried About Programme researcher, broadcasting/film/video in the Last Year:   . Barista in the Last Year:   Transportation Needs:   . Freight forwarder (Medical):   Marland Kitchen  Lack of Transportation (Non-Medical):   Physical Activity:   . Days of Exercise per Week:   . Minutes of Exercise per Session:   Stress:   . Feeling of Stress :   Social Connections:   . Frequency of Communication with Friends and Family:   . Frequency of Social Gatherings with Friends and Family:   . Attends Religious Services:   . Active Member of Clubs or Organizations:   . Attends Banker Meetings:   Marland Kitchen Marital Status:   Intimate Partner Violence:   . Fear of Current or Ex-Partner:   . Emotionally Abused:   Marland Kitchen Physically Abused:   . Sexually Abused:    Current Outpatient Medications on File  Prior to Visit  Medication Sig Dispense Refill  . glucose blood test strip Use as instructed 2x a day - One Touch Ultra 200 each 3  . Lancets (ONETOUCH ULTRASOFT) lancets Use as instructed 2x a day - for One Touch Ultr meter 200 each 3  . metFORMIN (GLUCOPHAGE-XR) 500 MG 24 hr tablet TAKE 2 TABLETS TWICE A DAY 360 tablet 3   No current facility-administered medications on file prior to visit.   Allergies  Allergen Reactions  . Metformin And Related     diarrhea  . Adhesive [Tape] Rash   Family History  Problem Relation Age of Onset  . Cancer Mother        Cervical s/p hysterectomy; mets to the abdomen  . Hypertension Mother   . Cancer Father        prostate cancer   PE: BP 118/70   Pulse 85   Ht 5\' 4"  (1.626 m)   Wt 231 lb (104.8 kg)   SpO2 96%   BMI 39.65 kg/m  Body mass index is 39.65 kg/m.  Wt Readings from Last 3 Encounters:  01/31/20 231 lb (104.8 kg)  08/02/19 245 lb (111.1 kg)  04/02/19 246 lb (111.6 kg)   Constitutional: overweight, in NAD Eyes: PERRLA, EOMI, no exophthalmos ENT: moist mucous membranes, no thyromegaly, no cervical lymphadenopathy Cardiovascular: RRR, No MRG Respiratory: CTA B Gastrointestinal: abdomen soft, NT, ND, BS+ Musculoskeletal: no deformities, strength intact in all 4 Skin: moist, warm, no rashes Neurological: no tremor  with outstretched hands, DTR normal in all 4  ASSESSMENT: 1. DM2, insulin-independent, now controlled, without long-term complications, but with hyperglycemia  Component     Latest Ref Rng & Units 06/14/2016  Hemoglobin A1C      6.4  Pancreatic Islet Cell Antibody     <5 JDF Units <5  Glutamic Acid Decarb Ab     <5 IU/mL <5  C-Peptide     0.80 - 3.85 ng/mL 2.64  Glucose, Fasting     65 - 99 mg/dL 08/14/2016 (H)  Labs consistent with type 2, rather than type 1 diabetes.  Component     Latest Ref Rng & Units 07/28/2018  Glutamic Acid Decarb Ab     <5 IU/mL <5  C-Peptide     0.80 - 3.85 ng/mL 1.36  Glucose, Plasma     65 - 99 mg/dL 07/30/2018 (H)  ZNT8 Antibodies     U/mL <15  Islet Cell Ab     Neg:<1:1 Negative  No signs of type 1 diabetes.  2. Obesity class II  3.  Dyslipidemia  PLAN:  1. Patient with longstanding, now well controlled type 2 diabetes, on Metformin only.  We were able to stop her insulin (Basaglar) and Januvia and we discussed about decreasing the dose of Metformin when she returns after the holidays if sugars remain controlled. At last visit they were all at goal improved after she stopped commuting to [place] daily during the coronavirus pandemic. -At this visit, sugars are worse without a clear explanation.  There is no change in diet, medication, activity.  She did get the first dose of her coronavirus vaccine last month, but this should not have caused such a significant increase in blood sugars.  At this visit, we discussed about adding an SGLT2 inhibitor, which we can stop in the future if her sugars improved.  We discussed about benefits and possible side effects.  I advised her to let me know if she develops any yeast infection.  I advised her to stay very well-hydrated while on this medication.  She was also instructed to stay in touch with me and let me know if the sugars do not improve within the next 3 weeks. - I advised her to:  Patient Instructions  Please  continue: - Metformin ER 1000 mg 2x a day  Please add: - Farxiga 5 mg daily before b'fast x1 week, then increase to 10 mg daily  Please return in 3-4 months with your sugar log.      - we checked her HbA1c: 9.1% (much higher) - advised to check sugars at different times of the day - 1x a day, rotating check times - advised for yearly eye exams >> she is not UTD - return to clinic in 6 months  2. Obesity class II -She gained a significant amount of weight after starting insulin (almost 20 pounds) however, her sugars improved and we were able to stop Basaglar and she lost 7 pounds immediately after. -Due to the coronavirus pandemic she did not need to come in to [place] every day and she was able to work on her diet and activity -Since last visit, she lost 14 more pounds but it is unclear whether these are not due to glucotoxicity!  3. Dyslipidemia  -Reviewed latest lipid panel from 07/2019: All fractions at goal Lab Results  Component Value Date   CHOL 134 08/02/2019   HDL 43.10 08/02/2019   LDLCALC 68 08/02/2019   TRIG 114.0 08/02/2019   CHOLHDL 3 08/02/2019  -She is not on a statin  Philemon Kingdom, MD PhD [facility]

## 2020-04-30 ENCOUNTER — Other Ambulatory Visit: Payer: Self-pay | Admitting: Internal Medicine

## 2020-05-01 ENCOUNTER — Other Ambulatory Visit: Payer: Self-pay

## 2020-05-01 DIAGNOSIS — E1165 Type 2 diabetes mellitus with hyperglycemia: Secondary | ICD-10-CM

## 2020-05-01 MED ORDER — DAPAGLIFLOZIN PROPANEDIOL 10 MG PO TABS: 10.0000 mg | ORAL_TABLET | Freq: Every day | ORAL | 2 refills | Status: DC

## 2020-05-03 DIAGNOSIS — E119 Type 2 diabetes mellitus without complications: Secondary | ICD-10-CM | POA: Diagnosis not present

## 2020-05-23 ENCOUNTER — Other Ambulatory Visit: Payer: Self-pay

## 2020-05-23 ENCOUNTER — Encounter: Payer: Self-pay | Admitting: Internal Medicine

## 2020-05-23 ENCOUNTER — Ambulatory Visit: Payer: BLUE CROSS/BLUE SHIELD | Admitting: Internal Medicine

## 2020-05-23 VITALS — BP 130/70 | HR 90 | Ht 64.0 in | Wt 222.0 lb

## 2020-05-23 DIAGNOSIS — E785 Hyperlipidemia, unspecified: Secondary | ICD-10-CM

## 2020-05-23 DIAGNOSIS — E1165 Type 2 diabetes mellitus with hyperglycemia: Secondary | ICD-10-CM

## 2020-05-23 DIAGNOSIS — Z6837 Body mass index (BMI) 37.0-37.9, adult: Secondary | ICD-10-CM | POA: Diagnosis not present

## 2020-05-23 LAB — BASIC METABOLIC PANEL
BUN: 11 mg/dL (ref 6–23)
CO2: 27 mEq/L (ref 19–32)
Calcium: 9.8 mg/dL (ref 8.4–10.5)
Chloride: 106 mEq/L (ref 96–112)
Creatinine, Ser: 0.98 mg/dL (ref 0.40–1.20)
GFR: 71.8 mL/min (ref >60.00)
Glucose, Bld: 92 mg/dL (ref 70–99)
Potassium: 4.3 mEq/L (ref 3.5–5.1)
Sodium: 141 mEq/L (ref 135–145)

## 2020-05-23 LAB — LIPID PANEL
Cholesterol: 147 mg/dL (ref 0–200)
HDL: 45.6 mg/dL (ref >39.00)
LDL Cholesterol: 82 mg/dL (ref 0–99)
NonHDL: 101.15
Total CHOL/HDL Ratio: 3
Triglycerides: 94 mg/dL (ref 0.0–149.0)
VLDL: 18.8 mg/dL (ref 0.0–40.0)

## 2020-05-23 LAB — POCT GLYCOSYLATED HEMOGLOBIN (HGB A1C): Hemoglobin A1C: 6.7 % — AB (ref 4.0–5.6)

## 2020-05-23 NOTE — Patient Instructions (Signed)
Please stop at the lab.  Please continue: - Metformin ER 1000 mg 2x a day - Farxiga 10 mg before fast  Please return in 4 months with your sugar log.

## 2020-05-23 NOTE — Progress Notes (Signed)
Patient ID: Kelly Simon, female   DOB: 1966/03/21, 54 y.o.   MRN: [id number]  This visit occurred during the SARS-CoV-2 public health emergency.  Safety protocols were in place, including screening questions prior to the visit, additional usage of staff PPE, and extensive cleaning of exam room while observing appropriate contact time as indicated for disinfecting solutions.   HPI: Kelly Simon is a 54 y.o.-year-old female, returning for f/u for DM2, dx 2006, previously insulin-dependent, now controlled without insulin, without long-term complications. Last visit 4 months ago.  Before last visit, she started to see much higher sugars (200s and up to 300s) but these improved in the week prior to our last appointment.  There was no clear explanation for decreasing blood sugars.  We added an SGLT2 inhibitor then.  Sugars improved significantly.  Reviewed HbA1c levels: Lab Results  Component Value Date   HGBA1C 9.1 (A) 01/31/2020   HGBA1C 5.7 (A) 08/02/2019   HGBA1C 6.6 (A) 04/02/2019   HGBA1C 6.4 (A) 11/30/2018   HGBA1C 6.0 (A) 07/28/2018   HGBA1C 6.9 (A) 03/13/2018   HGBA1C 14.3 12/06/2017   HGBA1C 8.2 10/28/2016   HGBA1C 6.4 06/14/2016   HGBA1C >15 02/25/2016   HGBA1C 6.8 (H) 12/10/2014   HGBA1C 7.2 (H) 09/10/2014   HGBA1C >14.0 04/12/2014   HGBA1C >14.0 11/15/2013   HGBA1C 6.0 05/02/2013   HGBA1C 5.4 01/17/2013   HGBA1C 7.6 10/12/2012   HGBA1C =>14.0 07/12/2012  She was on steroids for back pain in 09-08/2014: Prednisone taper pack, then injections in back. Sugars spiked then. She was taken off her diabetes meds in 2014 as her A1c was great >> but then sugars got out of control, in the 400s..  Pt is on a regimen of: - Metformin ER 1000 mg twice a day - Farxiga 10 mg before breakfast-added 01/2020 -  >> stopped 03/2019 due to very well controlled blood sugars -  >> stopped 11/2018 due to very well controlled blood sugars She stopped Glipizide  And Glipizide 5 mg  bid b/c of low CBGs. She had problems with Metformin - tried this in 2011: abdominal cramps and diarrhea.   Pt checks her sugars 1-3 times a day: - am: 71-82, 112 >> 73-98 >> 112-222 (then: 120-133) >> 80, 108-131, 135 - 2.5 h after b'fast: n/c >> 100-112 >> n/c - before lunch: n/c >> 112-213 (closer to 200; then 160-170) >> 72, 90-110 - 2h after lunch:110-120 >> n/c >> 90s >> 90 >> n/c - before dinner: n/c >> 99-100 >> 90s >> 90 >> 89-124 >> 90-100 - 2h after dinner:115-118 >> n/c  >> <100 >> n/c >> <135 - bedtime: 340-350 >> 90-97 >> 99-100 >> <89 >> 90s >> n/c She had lows before stopping Glipizide (34, 58) >>... 71 >> 73 >> 89 >> 72; she has hypoglycemia awareness in the 60s. Highest sugar was 119 >> 98 >> 300s >> 138.  Pt's meals are: - Breakfast: yoghurt or bowl of grits/coffee/banana - Lunch + Dinner: grilled chicken salad - Snacks: veggies, bananas, oranges, crackers; no sodas  -No CKD: BUN/creatinine:  Lab Results  Component Value Date   BUN 13 08/02/2019   CREATININE 0.82 08/02/2019  Not on ACE inhibitor/ARB. -+ Dyslipidemia; last set of lipids: Lab Results  Component Value Date   CHOL 134 08/02/2019   HDL 43.10 08/02/2019   LDLCALC 68 08/02/2019   TRIG 114.0 08/02/2019   CHOLHDL 3 08/02/2019  She is not on a statin. 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  She used to commute to [place] daily!  Then working from home during the coronavirus pandemic.  ROS: Constitutional: no weight gain/no weight loss, no fatigue, no subjective hyperthermia, no subjective hypothermia Eyes: no blurry vision, no xerophthalmia ENT: no sore throat, no nodules palpated in neck, no dysphagia, no odynophagia, no hoarseness Cardiovascular: no CP/no SOB/no palpitations/no leg swelling Respiratory: no cough/no SOB/no wheezing Gastrointestinal: no  N/no V/no D/no C/no acid reflux Musculoskeletal: no muscle aches/no joint aches Skin: no rashes, no hair loss Neurological: no tremors/+ numbness/no tingling/no dizziness  I reviewed pt's medications, allergies, PMH, social hx, family hx, and changes were documented in the history of present illness. Otherwise, unchanged from my initial visit note.  Past Medical History:  Diagnosis Date  . Depression   . Diabetes mellitus without complication (HCC)   . Low back pain   . Numbness and tingling of left leg    Past Surgical History:  Procedure Laterality Date  . ABDOMINAL HYSTERECTOMY  2005   ADENOMYOSIS; ovaries resected.  . CRYOABLATION  2004  . LAPAROSCOPIC ABDOMINAL EXPLORATION    . UPPER GASTROINTESTINAL ENDOSCOPY     Social History   Socioeconomic History  . Marital status: Married    Spouse name: Cristal DeerChristopher  . Number of children: 3  . Years of education: 12+  . Highest education level: Not on file  Occupational History  . Occupation: CUSTOMER SERVICE SUPERVISOR    Employer: [employer]  Tobacco Use  . Smoking status: Former Smoker    Years: 26.00    Types: Cigarettes    Quit date: 04/03/2008    Years since quitting: 12.1  . Smokeless tobacco: Never Used  Substance and Sexual Activity  . Alcohol use: Yes    Alcohol/week: 0.0 standard drinks    Comment: Occasionally  . Drug use: No  . Sexual activity: Yes    Partners: Male    Birth control/protection: Surgical  Other Topics Concern  . Not on file  Social History Narrative   Lives at home with husband.    Three sons are grown and live independently.    Right-handed.   2 cups caffeine/day.   Social Determinants of Health   Financial Resource Strain:   . Difficulty of Paying Living Expenses: Not on file  Food Insecurity:   . Worried About Programme researcher, broadcasting/film/videounning Out of Food in the Last Year: Not on file  . Ran Out of Food in the Last Year: Not on file  Transportation Needs:   . Lack of Transportation (Medical): Not  on file  . Lack of Transportation (Non-Medical): Not on file  Physical Activity:   . Days of Exercise per Week: Not on file  . Minutes of Exercise per Session: Not on file  Stress:   . Feeling of Stress : Not on file  Social Connections:   . Frequency of Communication with Friends and Family: Not on file  . Frequency of Social Gatherings with Friends and Family: Not on file  . Attends Religious Services: Not on file  . Active Member of Clubs or Organizations: Not on file  . Attends BankerClub or Organization Meetings: Not on file  . Marital Status: Not on file  Intimate Partner Violence:   . Fear of Current or Ex-Partner: Not on file  . Emotionally Abused: Not  on file  . Physically Abused: Not on file  . Sexually Abused: Not on file   Current Outpatient Medications on File Prior to Visit  Medication Sig Dispense Refill  . dapagliflozin propanediol (FARXIGA) 10 MG TABS tablet Take 1 tablet (10 mg total) by mouth daily before breakfast. 90 tablet 2  . glucose blood test strip Use as instructed 2x a day - One Touch Ultra 200 each 3  . Lancets (ONETOUCH ULTRASOFT) lancets USE AS INSTRUCTED TWICE A DAY (FOR ONE TOUCH ULTRA METER) 200 each 3  . metFORMIN (GLUCOPHAGE-XR) 500 MG 24 hr tablet TAKE 2 TABLETS TWICE A DAY 360 tablet 3   No current facility-administered medications on file prior to visit.   Allergies  Allergen Reactions  . Metformin And Related     diarrhea  . Adhesive [Tape] Rash   Family History  Problem Relation Age of Onset  . Cancer Mother        Cervical s/p hysterectomy; mets to the abdomen  . Hypertension Mother   . Cancer Father        prostate cancer   PE: BP 130/70   Pulse 90   Ht 5\' 4"  (1.626 m)   Wt 222 lb (100.7 kg)   SpO2 96%   BMI 38.11 kg/m  Body mass index is 38.11 kg/m.  Wt Readings from Last 3 Encounters:  05/23/20 222 lb (100.7 kg)  01/31/20 231 lb (104.8 kg)  08/02/19 245 lb (111.1 kg)   Constitutional: overweight, in NAD Eyes: PERRLA,  EOMI, no exophthalmos ENT: moist mucous membranes, no thyromegaly, no cervical lymphadenopathy Cardiovascular: tachycardia, RR, No MRG Respiratory: CTA B Gastrointestinal: abdomen soft, NT, ND, BS+ Musculoskeletal: no deformities, strength intact in all 4 Skin: moist, warm, no rashes Neurological: no tremor with outstretched hands, DTR normal in all 4  ASSESSMENT: 1. DM2, insulin-independent, uncontrolled, without long-term complications, but with hyperglycemia  Component     Latest Ref Rng & Units 06/14/2016  Hemoglobin A1C      6.4  Pancreatic Islet Cell Antibody     <5 JDF Units <5  Glutamic Acid Decarb Ab     <5 IU/mL <5  C-Peptide     0.80 - 3.85 ng/mL 2.64  Glucose, Fasting     65 - 99 mg/dL 08/14/2016 (H)  Labs consistent with type II, rather than type 1 diabetes.  Component     Latest Ref Rng & Units 07/28/2018  Glutamic Acid Decarb Ab     <5 IU/mL <5  C-Peptide     0.80 - 3.85 ng/mL 1.36  Glucose, Plasma     65 - 99 mg/dL 07/30/2018 (H)  ZNT8 Antibodies     U/mL <15  Islet Cell Ab     Neg:<1:1 Negative  No signs of type 1 diabetes.  2. Obesity class II  3.  Dyslipidemia  PLAN:  1. Patient with longstanding previously uncontrolled diabetes, which improved in the last 2 years so she could come off insulin 660), however, it worsened before last visit, without a clear reason.  At that time, sugars were in the 200s and 300s but they started to improve in the week prior to our appointment.  At that time, I suggested to add an SGLT2 inhibitor.  We started Farxiga 5 mg daily and increase to 10 mg daily afterwards.  She is tolerating this well.  No yeast infections or dehydration. -At this visit, sugars are excellent, all at target with exception of few sugars in the 130s in  the morning.  She is tolerating [place] very well, without side effects.  We will continue the current regimen. - We will check a BMP to check a GFR and a potassium level today since we started the SGLT2  inhibitor at last visit - I advised her to:  Patient Instructions  Please stop at the lab.  Please continue: - Metformin ER 1000 mg 2x a day - Farxiga 10 mg before fast  Please return in 4 months with your sugar log.      - we checked her HbA1c: 6.7% (much better) - advised to check sugars at different times of the day - 1x a day, rotating check times - advised for yearly eye exams >> she is UTD - return to clinic in 3-4 months  2. Obesity class II -After the start of the coronavirus pandemic she was not commuting to [place] every day and is able to work on her diet and activity -Before last visit she lost 15 more pounds but this could have been caused by glucotoxicity -However, since last visit she lost another 9 pounds after addition of [place].  We will continue this.  3. Dyslipidemia  -Reviewed latest lipid panel from 07/2019: All fractions at goal Lab Results  Component Value Date   CHOL 134 08/02/2019   HDL 43.10 08/02/2019   LDLCALC 68 08/02/2019   TRIG 114.0 08/02/2019   CHOLHDL 3 08/02/2019  -She is not on a statin -We would check a lipid panel today  Office Visit on 05/23/2020  Component Date Value Ref Range Status  . Cholesterol 05/23/2020 147  0 - 200 mg/dL Final   ATP III Classification       Desirable:  < 200 mg/dL               Borderline High:  200 - 239 mg/dL          High:  > = 960 mg/dL  . Triglycerides 05/23/2020 94.0  0 - 149 mg/dL Final   Normal:  <454 mg/dLBorderline High:  150 - 199 mg/dL  . HDL 05/23/2020 45.60  >39.00 mg/dL Final  . VLDL 09/81/1914 18.8  0.0 - 40.0 mg/dL Final  . LDL Cholesterol 05/23/2020 82  0 - 99 mg/dL Final  . Total CHOL/HDL Ratio 05/23/2020 3   Final                  Men          Women1/2 Average Risk     3.4          3.3Average Risk          5.0          4.42X Average Risk          9.6          7.13X Average Risk          15.0          11.0                      . NonHDL 05/23/2020 101.15   Final   NOTE:  Non-HDL goal  should be 30 mg/dL higher than patient's LDL goal (i.e. LDL goal of < 70 mg/dL, would have non-HDL goal of < 100 mg/dL)  . Sodium 05/23/2020 141  135 - 145 mEq/L Final  . Potassium 05/23/2020 4.3  3.5 - 5.1 mEq/L Final  . Chloride 05/23/2020 106  96 - 112 mEq/L Final  .  CO2 05/23/2020 27  19 - 32 mEq/L Final  . Glucose, Bld 05/23/2020 92  70 - 99 mg/dL Final  . BUN 59/16/3846 11  6 - 23 mg/dL Final  . Creatinine, Ser 05/23/2020 0.98  0.40 - 1.20 mg/dL Final  . GFR 65/99/3570 71.80  >60.00 mL/min Final  . Calcium 05/23/2020 9.8  8.4 - 10.5 mg/dL Final  . Hemoglobin V7B 05/23/2020 6.7* 4.0 - 5.6 % Final     Carlus Pavlov, MD PhD [facility] Endocrinology

## 2020-07-21 DIAGNOSIS — F411 Generalized anxiety disorder: Secondary | ICD-10-CM | POA: Diagnosis not present

## 2020-07-21 DIAGNOSIS — F321 Major depressive disorder, single episode, moderate: Secondary | ICD-10-CM | POA: Diagnosis not present

## 2020-07-28 DIAGNOSIS — F321 Major depressive disorder, single episode, moderate: Secondary | ICD-10-CM | POA: Diagnosis not present

## 2020-07-28 DIAGNOSIS — F411 Generalized anxiety disorder: Secondary | ICD-10-CM | POA: Diagnosis not present

## 2020-08-04 DIAGNOSIS — F321 Major depressive disorder, single episode, moderate: Secondary | ICD-10-CM | POA: Diagnosis not present

## 2020-08-04 DIAGNOSIS — F411 Generalized anxiety disorder: Secondary | ICD-10-CM | POA: Diagnosis not present

## 2020-08-11 DIAGNOSIS — F411 Generalized anxiety disorder: Secondary | ICD-10-CM | POA: Diagnosis not present

## 2020-08-11 DIAGNOSIS — F321 Major depressive disorder, single episode, moderate: Secondary | ICD-10-CM | POA: Diagnosis not present

## 2020-08-18 DIAGNOSIS — F321 Major depressive disorder, single episode, moderate: Secondary | ICD-10-CM | POA: Diagnosis not present

## 2020-08-18 DIAGNOSIS — F411 Generalized anxiety disorder: Secondary | ICD-10-CM | POA: Diagnosis not present

## 2020-09-01 DIAGNOSIS — F411 Generalized anxiety disorder: Secondary | ICD-10-CM | POA: Diagnosis not present

## 2020-09-01 DIAGNOSIS — F321 Major depressive disorder, single episode, moderate: Secondary | ICD-10-CM | POA: Diagnosis not present

## 2020-09-08 DIAGNOSIS — F411 Generalized anxiety disorder: Secondary | ICD-10-CM | POA: Diagnosis not present

## 2020-09-08 DIAGNOSIS — F321 Major depressive disorder, single episode, moderate: Secondary | ICD-10-CM | POA: Diagnosis not present

## 2020-09-15 DIAGNOSIS — F321 Major depressive disorder, single episode, moderate: Secondary | ICD-10-CM | POA: Diagnosis not present

## 2020-09-15 DIAGNOSIS — F411 Generalized anxiety disorder: Secondary | ICD-10-CM | POA: Diagnosis not present

## 2020-09-23 ENCOUNTER — Ambulatory Visit: Payer: BLUE CROSS/BLUE SHIELD | Admitting: Internal Medicine

## 2020-09-23 ENCOUNTER — Other Ambulatory Visit: Payer: Self-pay

## 2020-09-23 ENCOUNTER — Encounter: Payer: Self-pay | Admitting: Internal Medicine

## 2020-09-23 VITALS — BP 120/88 | HR 80 | Ht 64.0 in | Wt 214.2 lb

## 2020-09-23 DIAGNOSIS — E785 Hyperlipidemia, unspecified: Secondary | ICD-10-CM

## 2020-09-23 DIAGNOSIS — E1165 Type 2 diabetes mellitus with hyperglycemia: Secondary | ICD-10-CM | POA: Diagnosis not present

## 2020-09-23 DIAGNOSIS — Z23 Encounter for immunization: Secondary | ICD-10-CM

## 2020-09-23 DIAGNOSIS — Z6837 Body mass index (BMI) 37.0-37.9, adult: Secondary | ICD-10-CM

## 2020-09-23 DIAGNOSIS — E66812 Obesity, class 2: Secondary | ICD-10-CM

## 2020-09-23 LAB — POCT GLYCOSYLATED HEMOGLOBIN (HGB A1C): Hemoglobin A1C: 6.1 % — AB (ref 4.0–5.6)

## 2020-09-23 NOTE — Progress Notes (Signed)
Patient ID: Kelly Simon, female   DOB: 1965/10/27, 54 y.o.   MRN: [id number]  This visit occurred during the SARS-CoV-2 public health emergency.  Safety protocols were in place, including screening questions prior to the visit, additional usage of staff PPE, and extensive cleaning of exam room while observing appropriate contact time as indicated for disinfecting solutions.   HPI: Kelly Simon is a 54 y.o.-year-old female, returning for f/u for DM2, dx 2006, previously insulin-dependent, now controlled without insulin, without long-term complications. Last visit 4 months ago.  Her sugars improved significantly after adding an SGLT2 inhibitor.  Reviewed HbA1c levels: Lab Results  Component Value Date   HGBA1C 6.7 (A) 05/23/2020   HGBA1C 9.1 (A) 01/31/2020   HGBA1C 5.7 (A) 08/02/2019   HGBA1C 6.6 (A) 04/02/2019   HGBA1C 6.4 (A) 11/30/2018   HGBA1C 6.0 (A) 07/28/2018   HGBA1C 6.9 (A) 03/13/2018   HGBA1C 14.3 12/06/2017   HGBA1C 8.2 10/28/2016   HGBA1C 6.4 06/14/2016   HGBA1C >15 02/25/2016   HGBA1C 6.8 (H) 12/10/2014   HGBA1C 7.2 (H) 09/10/2014   HGBA1C >14.0 04/12/2014   HGBA1C >14.0 11/15/2013   HGBA1C 6.0 05/02/2013   HGBA1C 5.4 01/17/2013   HGBA1C 7.6 10/12/2012   HGBA1C =>14.0 07/12/2012  She was on steroids for back pain in 09-08/2014: Prednisone taper pack, then injections in back. Sugars spiked then. She was taken off her diabetes meds in 2014 as her A1c was great >> but then sugars got out of control, in the 400s.  Pt is on a regimen of: - Metformin ER 1000 mg twice a day - Farxiga 10 mg before breakfast-added 01/2020 -  >> stopped 03/2019 due to well-controlled blood sugars -  >> stopped 11/2018 due to uncontrolled blood sugars She stopped Glipizide  And Glipizide 5 mg bid b/c of low CBGs. She had problems with Metformin - tried this in 2011: abdominal cramps and diarrhea.   Pt checks her sugars 1-3 times a day: - am: 73-98 >> 112-222 >> 80, 108-131,  135 >> 80s-110, 131 - 2.5 h after b'fast: n/c >> 100-112 >> n/c - before lunch: n/c >> 112-213 >> 72, 90-110 >> n/c - 2h after lunch:110-120 >> n/c >> 90s >> 90 >> n/c - before dinner: 90s >> 90 >> 89-124 >> 90-100 >> n/c - 2h after dinner: n/c  >> <100 >> n/c >> <135 >> n/c - bedtime: 90-97 >> 99-100 >> <89 >> 90s >> n/c She had lows before stopping Glipizide (34, 58) >>...89 >> 72 >> 80; she has hypoglycemia awareness in the 60s. Highest sugar was  300s >> 138 >> 110.  Pt's meals are: - Breakfast: yoghurt or bowl of grits/coffee/banana - Lunch + Dinner: grilled chicken salad - Snacks: veggies, bananas, oranges, crackers; no sodas  -No CKD: BUN/creatinine:  Lab Results  Component Value Date   BUN 11 05/23/2020   CREATININE 0.98 05/23/2020  Not on ACE inhibitor/ARB. -+ Mild dyslipidemia; last set of lipids: Lab Results  Component Value Date   CHOL 147 05/23/2020   HDL 45.60 05/23/2020   LDLCALC 82 05/23/2020   TRIG 94.0 05/23/2020   CHOLHDL 3 05/23/2020  She is not on a statin. 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  She used to commute to [place] daily!  She started to  work from home during the coronavirus pandemic.  ROS: Constitutional: no weight gain/no weight loss, no fatigue, no subjective hyperthermia, no subjective hypothermia Eyes: no blurry vision, no xerophthalmia ENT: no sore throat, no nodules palpated in neck, no dysphagia, no odynophagia, no hoarseness Cardiovascular: no CP/no SOB/no palpitations/no leg swelling Respiratory: no cough/no SOB/no wheezing Gastrointestinal: no N/no V/no D/no C/no acid reflux Musculoskeletal: no muscle aches/no joint aches Skin: no rashes, no hair loss Neurological: no tremors/+ numbness/no tingling/no dizziness  I reviewed pt's medications, allergies, PMH, social hx, family hx,  and changes were documented in the history of present illness. Otherwise, unchanged from my initial visit note.  Past Medical History:  Diagnosis Date  . Depression   . Diabetes mellitus without complication (HCC)   . Low back pain   . Numbness and tingling of left leg    Past Surgical History:  Procedure Laterality Date  . ABDOMINAL HYSTERECTOMY  2005   ADENOMYOSIS; ovaries resected.  . CRYOABLATION  2004  . LAPAROSCOPIC ABDOMINAL EXPLORATION    . UPPER GASTROINTESTINAL ENDOSCOPY     Social History   Socioeconomic History  . Marital status: Married    Spouse name: Kelly Simon  . Number of children: 3  . Years of education: 12+  . Highest education level: Not on file  Occupational History  . Occupation: CUSTOMER SERVICE SUPERVISOR    Employer: [employer]  Tobacco Use  . Smoking status: Former Smoker    Years: 26.00    Types: Cigarettes    Quit date: 04/03/2008    Years since quitting: 12.4  . Smokeless tobacco: Never Used  Substance and Sexual Activity  . Alcohol use: Yes    Alcohol/week: 0.0 standard drinks    Comment: Occasionally  . Drug use: No  . Sexual activity: Yes    Partners: Male    Birth control/protection: Surgical  Other Topics Concern  . Not on file  Social History Narrative   Lives at home with husband.    Three sons are grown and live independently.    Right-handed.   2 cups caffeine/day.   Social Determinants of Health   Financial Resource Strain: Not on file  Food Insecurity: Not on file  Transportation Needs: Not on file  Physical Activity: Not on file  Stress: Not on file  Social Connections: Not on file  Intimate Partner Violence: Not on file   Current Outpatient Medications on File Prior to Visit  Medication Sig Dispense Refill  . dapagliflozin propanediol (FARXIGA) 10 MG TABS tablet Take 1 tablet (10 mg total) by mouth daily before breakfast. 90 tablet 2  . glucose blood test strip Use as instructed 2x a day - One Touch Ultra  200 each 3  . Lancets (ONETOUCH ULTRASOFT) lancets USE AS INSTRUCTED TWICE A DAY (FOR ONE TOUCH ULTRA METER) 200 each 3  . metFORMIN (GLUCOPHAGE-XR) 500 MG 24 hr tablet TAKE 2 TABLETS TWICE A DAY 360 tablet 3   No current facility-administered medications on file prior to visit.   Allergies  Allergen Reactions  . Metformin And Related     diarrhea  . Adhesive [Tape] Rash   Family History  Problem Relation Age of Onset  . Cancer Mother        Cervical s/p hysterectomy; mets to the abdomen  . Hypertension Mother   . Cancer Father        prostate cancer   PE: BP 120/88   Pulse 80   Ht 5\' 4"  (1.626 m)  Wt 214 lb 3.2 oz (97.2 kg)   SpO2 98%   BMI 36.77 kg/m  Body mass index is 36.77 kg/m.  Wt Readings from Last 3 Encounters:  09/23/20 214 lb 3.2 oz (97.2 kg)  05/23/20 222 lb (100.7 kg)  01/31/20 231 lb (104.8 kg)   Constitutional: overweight, in NAD Eyes: PERRLA, EOMI, no exophthalmos ENT: moist mucous membranes, no thyromegaly, no cervical lymphadenopathy Cardiovascular: RRR, No MRG Respiratory: CTA B Gastrointestinal: abdomen soft, NT, ND, BS+ Musculoskeletal: no deformities, strength intact in all 4 Skin: moist, warm, no rashes Neurological: no tremor with outstretched hands, DTR normal in all 4  ASSESSMENT: 1. DM2, insulin-independent, uncontrolled, without long-term complications, but with hyperglycemia  Component     Latest Ref Rng & Units 06/14/2016  Hemoglobin A1C      6.4  Pancreatic Islet Cell Antibody     <5 JDF Units <5  Glutamic Acid Decarb Ab     <5 IU/mL <5  C-Peptide     0.80 - 3.85 ng/mL 2.64  Glucose, Fasting     65 - 99 mg/dL 364 (H)  Labs consistent with type II, rather than type 1 diabetes.  Component     Latest Ref Rng & Units 07/28/2018  Glutamic Acid Decarb Ab     <5 IU/mL <5  C-Peptide     0.80 - 3.85 ng/mL 1.36  Glucose, Plasma     65 - 99 mg/dL 680 (H)  ZNT8 Antibodies     U/mL <15  Islet Cell Ab     Neg:<1:1 Negative  No  signs of type 1 diabetes.  2. Obesity class II  3.  Dyslipidemia  PLAN:  1. 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  Before our last visit in 01/2020, sugars worsened to the 200s-300s, but we started an SGLT2 inhibitor and they started to improve afterwards.  She has no side effects from the medication, no yeast infections or dehydration.  At last visit, sugars were excellent with few exceptions that were slightly higher than goal.  We did not change her regimen at that time.  HbA1c was improved, at 6.7%. -At today's visit, sugars are excellent in the morning, almost all at goal, but she is not checking later in the day. - we checked her HbA1c: 6.1% (improved) -We will not change her regimen today, but I did advise her to check some sugars later in the day, also, rotating check times - I advised her to:  Patient Instructions  Please continue: - Metformin ER 1000 mg 2x a day - Farxiga 10 mg before fast  Please return in 4 months with your sugar log.      - advised to check sugars at different times of the day - 1x a day, rotating check times - advised for yearly eye exams >> she is UTD - return to clinic in 4 months  2. Obesity class II -After the start of the coronavirus pandemic she was not commuting to [place] every day and is able to work on her diet and activity -Before last visit, she lost 9 pounds after addition of [place].  She previously lost 15 more pounds, but this could have been related to glucotoxicity. She lost 8 more pounds since last visit!  3. Dyslipidemia 8 more lbs since las OV. -Reviewed latest lipid panel from 05/2020, all fractions at goal: Lab Results  Component Value Date   CHOL  147 05/23/2020   HDL 45.60 05/23/2020   LDLCALC 82 05/23/2020   TRIG 94.0 05/23/2020   CHOLHDL 3 05/23/2020  -She is not on a statin  Carlus Pavlov, MD PhD [facility]  Endocrinology

## 2020-09-23 NOTE — Addendum Note (Signed)
Addended by: Kenyon Ana on: 09/23/2020 10:05 AM   Modules accepted: Orders

## 2020-09-23 NOTE — Patient Instructions (Signed)
Please continue: - Metformin ER 1000 mg 2x a day - Farxiga 10 mg before fast  Please return in 4 months with your sugar log.

## 2020-09-25 DIAGNOSIS — F411 Generalized anxiety disorder: Secondary | ICD-10-CM | POA: Diagnosis not present

## 2020-09-25 DIAGNOSIS — F321 Major depressive disorder, single episode, moderate: Secondary | ICD-10-CM | POA: Diagnosis not present

## 2020-10-01 ENCOUNTER — Other Ambulatory Visit: Payer: Self-pay | Admitting: Internal Medicine

## 2020-10-06 ENCOUNTER — Other Ambulatory Visit: Payer: Self-pay | Admitting: Internal Medicine

## 2020-10-06 DIAGNOSIS — F411 Generalized anxiety disorder: Secondary | ICD-10-CM | POA: Diagnosis not present

## 2020-10-06 DIAGNOSIS — F321 Major depressive disorder, single episode, moderate: Secondary | ICD-10-CM | POA: Diagnosis not present

## 2021-01-27 ENCOUNTER — Ambulatory Visit: Payer: BLUE CROSS/BLUE SHIELD | Admitting: Internal Medicine

## 2021-02-03 ENCOUNTER — Other Ambulatory Visit: Payer: Self-pay | Admitting: Endocrinology

## 2021-02-03 DIAGNOSIS — E1165 Type 2 diabetes mellitus with hyperglycemia: Secondary | ICD-10-CM

## 2021-09-28 ENCOUNTER — Other Ambulatory Visit: Payer: Self-pay | Admitting: Internal Medicine

## 2021-10-01 ENCOUNTER — Other Ambulatory Visit: Payer: Self-pay | Admitting: Internal Medicine
